# Patient Record
Sex: Female | Born: 1990 | Race: White | Hispanic: No | Marital: Single | Smoking: Current every day smoker
Health system: Southern US, Community
[De-identification: ages and names within clinical notes are randomized; demographics above are authoritative.]

## PROBLEM LIST (undated history)

## (undated) ENCOUNTER — Inpatient Hospital Stay: Payer: Self-pay

## (undated) DIAGNOSIS — F32A Depression, unspecified: Secondary | ICD-10-CM

## (undated) DIAGNOSIS — F319 Bipolar disorder, unspecified: Secondary | ICD-10-CM

## (undated) DIAGNOSIS — F329 Major depressive disorder, single episode, unspecified: Secondary | ICD-10-CM

## (undated) DIAGNOSIS — F419 Anxiety disorder, unspecified: Secondary | ICD-10-CM

## (undated) DIAGNOSIS — F609 Personality disorder, unspecified: Secondary | ICD-10-CM

## (undated) HISTORY — PX: MYRINGOTOMY: SHX2060

## (undated) HISTORY — PX: TUBAL LIGATION: SHX77

---

## 2015-03-12 ENCOUNTER — Encounter: Payer: Self-pay | Admitting: Emergency Medicine

## 2015-03-12 ENCOUNTER — Emergency Department
Admission: EM | Admit: 2015-03-12 | Discharge: 2015-03-12 | Disposition: A | Discharge status: Home / Self Care | Payer: Self-pay | Attending: Emergency Medicine | Admitting: Emergency Medicine

## 2015-03-12 ENCOUNTER — Emergency Department: Payer: Self-pay

## 2015-03-12 DIAGNOSIS — F17210 Nicotine dependence, cigarettes, uncomplicated: Secondary | ICD-10-CM | POA: Insufficient documentation

## 2015-03-12 DIAGNOSIS — N938 Other specified abnormal uterine and vaginal bleeding: Secondary | ICD-10-CM | POA: Insufficient documentation

## 2015-03-12 DIAGNOSIS — R197 Diarrhea, unspecified: Secondary | ICD-10-CM | POA: Insufficient documentation

## 2015-03-12 DIAGNOSIS — Z3202 Encounter for pregnancy test, result negative: Secondary | ICD-10-CM | POA: Insufficient documentation

## 2015-03-12 DIAGNOSIS — R102 Pelvic and perineal pain: Secondary | ICD-10-CM

## 2015-03-12 HISTORY — DX: Depression, unspecified: F32.A

## 2015-03-12 HISTORY — DX: Major depressive disorder, single episode, unspecified: F32.9

## 2015-03-12 LAB — CBC
HEMATOCRIT: 40.1 % (ref 35.0–47.0)
Hemoglobin: 13.4 g/dL (ref 12.0–16.0)
MCH: 29 pg (ref 26.0–34.0)
MCHC: 33.3 g/dL (ref 32.0–36.0)
MCV: 86.9 fL (ref 80.0–100.0)
Platelets: 233 10*3/uL (ref 150–440)
RBC: 4.62 MIL/uL (ref 3.80–5.20)
RDW: 14.2 % (ref 11.5–14.5)
WBC: 12 10*3/uL — AB (ref 3.6–11.0)

## 2015-03-12 LAB — COMPREHENSIVE METABOLIC PANEL
ALT: 14 U/L (ref 14–54)
ANION GAP: 7 (ref 5–15)
AST: 18 U/L (ref 15–41)
Albumin: 4.3 g/dL (ref 3.5–5.0)
Alkaline Phosphatase: 74 U/L (ref 38–126)
BILIRUBIN TOTAL: 0.3 mg/dL (ref 0.3–1.2)
BUN: 17 mg/dL (ref 6–20)
CO2: 18 mmol/L — ABNORMAL LOW (ref 22–32)
Calcium: 9 mg/dL (ref 8.9–10.3)
Chloride: 115 mmol/L — ABNORMAL HIGH (ref 101–111)
Creatinine, Ser: 0.89 mg/dL (ref 0.44–1.00)
Glucose, Bld: 110 mg/dL — ABNORMAL HIGH (ref 65–99)
POTASSIUM: 3.8 mmol/L (ref 3.5–5.1)
Sodium: 140 mmol/L (ref 135–145)
TOTAL PROTEIN: 7.7 g/dL (ref 6.5–8.1)

## 2015-03-12 LAB — WET PREP, GENITAL
Clue Cells Wet Prep HPF POC: NONE SEEN
TRICH WET PREP: NONE SEEN
YEAST WET PREP: NONE SEEN

## 2015-03-12 LAB — URINALYSIS COMPLETE WITH MICROSCOPIC (ARMC ONLY)
BILIRUBIN URINE: NEGATIVE
Bacteria, UA: NONE SEEN
GLUCOSE, UA: NEGATIVE mg/dL
KETONES UR: NEGATIVE mg/dL
LEUKOCYTES UA: NEGATIVE
Nitrite: NEGATIVE
PH: 5 (ref 5.0–8.0)
Protein, ur: NEGATIVE mg/dL
Specific Gravity, Urine: 1.017 (ref 1.005–1.030)

## 2015-03-12 LAB — POCT PREGNANCY, URINE: Preg Test, Ur: NEGATIVE

## 2015-03-12 LAB — LIPASE, BLOOD: Lipase: 22 U/L (ref 11–51)

## 2015-03-12 LAB — CHLAMYDIA/NGC RT PCR (ARMC ONLY)
Chlamydia Tr: NOT DETECTED
N gonorrhoeae: NOT DETECTED

## 2015-03-12 MED ORDER — OXYCODONE-ACETAMINOPHEN 5-325 MG PO TABS
1.0000 | ORAL_TABLET | Freq: Once | ORAL | Status: AC
  Administered 2015-03-12: 1 via ORAL
  Filled 2015-03-12: qty 1

## 2015-03-12 MED ORDER — OXYCODONE-ACETAMINOPHEN 5-325 MG PO TABS: 1.0000 | ORAL_TABLET | ORAL | Status: DC | PRN

## 2015-03-12 MED ORDER — ONDANSETRON 4 MG PO TBDP
4.0000 mg | ORAL_TABLET | Freq: Once | ORAL | Status: AC
  Administered 2015-03-12: 4 mg via ORAL
  Filled 2015-03-12: qty 1

## 2015-03-12 MED ORDER — IBUPROFEN 800 MG PO TABS: 800.0000 mg | ORAL_TABLET | Freq: Three times a day (TID) | ORAL | Status: DC | PRN

## 2015-03-12 NOTE — ED Provider Notes (Signed)
[facility] Emergency Department Provider Note  ____________________________________________  Time seen: Approximately 3:13 AM  I have reviewed the triage vital signs and the nursing notes.   HISTORY  Chief Complaint Abdominal Pain    HPI Maria Wallace is a 24 y.o. female who presents to the ED with a chief complaint of pelvic pain. Patient reports pelvic cramping for 3 weeks. She has an Implanon that was removed 2 years ago but has not done so secondary to financial reasons. Patient denies associated symptoms of fever, chills, nausea, vomiting. Patient reports diarrhea which started yesterday. Denies recent travel or trauma. Denies chest pain, shortness of breath, vaginal discharge, vaginal bleeding. Last sexual intercourse yesterday. Nothing makes her symptoms better or worse.   Past Medical History  Diagnosis Date  . Depression     There are no active problems to display for this patient.   Past Surgical History  Procedure Laterality Date  . Myringotomy      Current Outpatient Rx  Name  Route  Sig  Dispense  Refill  . diazepam (VALIUM) 5 MG tablet   Oral   Take 5 mg by mouth every 8 (eight) hours as needed for muscle spasms (cramping).          Marland Kitchen ibuprofen (ADVIL,MOTRIN) 800 MG tablet   Oral   Take 800 mg by mouth every 8 (eight) hours as needed for mild pain, moderate pain or cramping.            Allergies Review of patient's allergies indicates no known allergies.  History reviewed. No pertinent family history.  Social History Social History  Substance Use Topics  . Smoking status: Current Every Day Smoker -- 1.00 packs/day    Types: Cigarettes  . Smokeless tobacco: None  . Alcohol Use: Yes    Review of Systems Constitutional: No fever/chills Eyes: No visual changes. ENT: No sore throat. Cardiovascular: Denies chest pain. Respiratory: Denies shortness of breath. Gastrointestinal: Positive for pelvic pain. No abdominal  pain.  No nausea, no vomiting.  Positive for mild diarrhea.  No constipation. Genitourinary: Negative for dysuria. Musculoskeletal: Negative for back pain. Skin: Negative for rash. Neurological: Negative for headaches, focal weakness or numbness.  10-point ROS otherwise negative.  ____________________________________________   PHYSICAL EXAM:  VITAL SIGNS: ED Triage Vitals  Enc Vitals Group     BP 03/12/15 0123 128/74 mmHg     Pulse Rate 03/12/15 0123 105     Resp 03/12/15 0123 18     Temp 03/12/15 0123 97.7 F (36.5 C)     Temp Source 03/12/15 0123 Oral     SpO2 03/12/15 0123 100 %     Weight 03/12/15 0123 160 lb (72.576 kg)     Height 03/12/15 0123  (1.676 m)     Head Cir --      Peak Flow --      Pain Score 03/12/15 0124 9     Pain Loc --      Pain Edu? --      Excl. in GC? --     Constitutional: Alert and oriented. Well appearing and in no acute distress. Eyes: Conjunctivae are normal. PERRL. EOMI. Head: Atraumatic. Nose: No congestion/rhinnorhea. Mouth/Throat: Mucous membranes are moist.  Oropharynx non-erythematous. Neck: No stridor.   Cardiovascular: Normal rate, regular rhythm. Grossly normal heart sounds.  Good peripheral circulation. Respiratory: Normal respiratory effort.  No retractions. Lungs CTAB. Gastrointestinal: Soft and nontender. No distention. No abdominal bruits. No CVA tenderness. Musculoskeletal: No lower  extremity tenderness nor edema.  No joint effusions. Neurologic:  Normal speech and language. No gross focal neurologic deficits are appreciated. No gait instability. Skin:  Skin is warm, dry and intact. No rash noted. Psychiatric: Mood and affect are normal. Speech and behavior are normal.  ____________________________________________   LABS (all labs ordered are listed, but only abnormal results are displayed)  Labs Reviewed  WET PREP, GENITAL - Abnormal; Notable for the following:    WBC, Wet Prep HPF POC FEW (*)    All other  components within normal limits  COMPREHENSIVE METABOLIC PANEL - Abnormal; Notable for the following:    Chloride 115 (*)    CO2 18 (*)    Glucose, Bld 110 (*)    All other components within normal limits  CBC - Abnormal; Notable for the following:    WBC 12.0 (*)    All other components within normal limits  URINALYSIS COMPLETEWITH MICROSCOPIC ([facility] ONLY) - Abnormal; Notable for the following:    Color, Urine YELLOW (*)    APPearance CLEAR (*)    Hgb urine dipstick 3+ (*)    Squamous Epithelial / LPF 0-5 (*)    All other components within normal limits  CHLAMYDIA/NGC RT PCR ([facility] ONLY)  LIPASE, BLOOD  POC URINE PREG, ED  POCT PREGNANCY, URINE   ____________________________________________  EKG  None ____________________________________________  RADIOLOGY  Pelvis Ultrasound interpreted per Dr. Karie KirksBloomer: No acute pelvic process.  Thinned endometrium can be seen with atrophy.  ____________________________________________   PROCEDURES  Procedure(s) performed:  Pelvic exam: External exam WNL without rashes, lesions or vesicles. Speculum exam reveals mild vaginal bleeding with closed cervical os. Bimanual exam WNL.  Critical Care performed: No  ____________________________________________   INITIAL IMPRESSION / ASSESSMENT AND PLAN / ED COURSE  Pertinent labs & imaging results that were available during my care of the patient were reviewed by me and considered in my medical decision making (see chart for details).  24 year old female who presents with a three-week history of pelvic pain. Mild vaginal bleeding noted on exam. Urine pregnancy test negative. Labs otherwise unremarkable. Will send for pelvic ultrasound.  ----------------------------------------- 6:11 AM on 03/12/2015 -----------------------------------------  Updated patient and spouse of imaging results. Strict return precautions given. Both verbalize understanding and agree with plan of  care. ____________________________________________   FINAL CLINICAL IMPRESSION(S) / ED DIAGNOSES  Final diagnoses:  Pelvic pain in female  DUB (dysfunctional uterine bleeding)      Irean HongJade J Eavan Gonterman, MD 03/12/15 [phone]

## 2015-03-12 NOTE — ED Notes (Signed)
Patient transported to Ultrasound 

## 2015-03-12 NOTE — Discharge Instructions (Signed)
1. Take pain medicines as needed (Motrin/Percocet #15). 2. Return to the ER for worsening symptoms, persistent vomiting, heavy vaginal bleeding or other concerns.  Pelvic Pain, Female Female pelvic pain can be caused by many different things and start from a variety of places. Pelvic pain refers to pain that is located in the lower half of the abdomen and between your hips. The pain may occur over a short period of time (acute) or may be reoccurring (chronic). The cause of pelvic pain may be related to disorders affecting the female reproductive organs (gynecologic), but it may also be related to the bladder, kidney stones, an intestinal complication, or muscle or skeletal problems. Getting help right away for pelvic pain is important, especially if there has been severe, sharp, or a sudden onset of unusual pain. It is also important to get help right away because some types of pelvic pain can be life threatening.  CAUSES  Below are only some of the causes of pelvic pain. The causes of pelvic pain can be in one of several categories.   Gynecologic.  Pelvic inflammatory disease.  Sexually transmitted infection.  Ovarian cyst or a twisted ovarian ligament (ovarian torsion).  Uterine lining that grows outside the uterus (endometriosis).  Fibroids, cysts, or tumors.  Ovulation.  Pregnancy.  Pregnancy that occurs outside the uterus (ectopic pregnancy).  Miscarriage.  Labor.  Abruption of the placenta or ruptured uterus.  Infection.  Uterine infection (endometritis).  Bladder infection.  Diverticulitis.  Miscarriage related to a uterine infection (septic abortion).  Bladder.  Inflammation of the bladder (cystitis).  Kidney stone(s).  Gastrointestinal.  Constipation.  Diverticulitis.  Neurologic.  Trauma.  Feeling pelvic pain because of mental or emotional causes (psychosomatic).  Cancers of the bowel or pelvis. EVALUATION  Your caregiver will want to take a  careful history of your concerns. This includes recent changes in your health, a careful gynecologic history of your periods (menses), and a sexual history. Obtaining your family history and medical history is also important. Your caregiver may suggest a pelvic exam. A pelvic exam will help identify the location and severity of the pain. It also helps in the evaluation of which organ system may be involved. In order to identify the cause of the pelvic pain and be properly treated, your caregiver may order tests. These tests may include:   A pregnancy test.  Pelvic ultrasonography.  An X-ray exam of the abdomen.  A urinalysis or evaluation of vaginal discharge.  Blood tests. HOME CARE INSTRUCTIONS   Only take over-the-counter or prescription medicines for pain, discomfort, or fever as directed by your caregiver.   Rest as directed by your caregiver.   Eat a balanced diet.   Drink enough fluids to make your urine clear or pale yellow, or as directed.   Avoid sexual intercourse if it causes pain.   Apply warm or cold compresses to the lower abdomen depending on which one helps the pain.   Avoid stressful situations.   Keep a journal of your pelvic pain. Write down when it started, where the pain is located, and if there are things that seem to be associated with the pain, such as food or your menstrual cycle.  Follow up with your caregiver as directed.  SEEK MEDICAL CARE IF:  Your medicine does not help your pain.  You have abnormal vaginal discharge. SEEK IMMEDIATE MEDICAL CARE IF:   You have heavy bleeding from the vagina.   Your pelvic pain increases.   You feel  light-headed or faint.   You have chills.   You have pain with urination or blood in your urine.   You have uncontrolled diarrhea or vomiting.   You have a fever or persistent symptoms for more than 3 days.  You have a fever and your symptoms suddenly get worse.   You are being physically or  sexually abused.   This information is not intended to replace advice given to you by your health care provider. Make sure you discuss any questions you have with your health care provider.   Document Released: 02/19/2004 Document Revised: 12/13/2014 Document Reviewed: 07/14/2011 Elsevier Interactive Patient Education 2016 Else[employer] Inc[employer]  Abnormal Uterine Bleeding Abnormal uterine bleeding can affect women at various stages in life, including teenagers, women in their reproductive years, pregnant women, and women who have reached menopause. Several kinds of uterine bleeding are considered abnormal, including:  Bleeding or spotting between periods.   Bleeding after sexual intercourse.   Bleeding that is heavier or more than normal.   Periods that last longer than usual.  Bleeding after menopause.  Many cases of abnormal uterine bleeding are minor and simple to treat, while others are more serious. Any type of abnormal bleeding should be evaluated by your health care provider. Treatment will depend on the cause of the bleeding. HOME CARE INSTRUCTIONS Monitor your condition for any changes. The following actions may help to alleviate any discomfort you are experiencing:  Avoid the use of tampons and douches as directed by your health care provider.  Change your pads frequently. You should get regular pelvic exams and Pap tests. Keep all follow-up appointments for diagnostic tests as directed by your health care provider.  SEEK MEDICAL CARE IF:   Your bleeding lasts more than 1 week.   You feel dizzy at times.  SEEK IMMEDIATE MEDICAL CARE IF:   You pass out.   You are changing pads every 15 to 30 minutes.   You have abdominal pain.  You have a fever.   You become sweaty or weak.   You are passing large blood clots from the vagina.   You start to feel nauseous and vomit. MAKE SURE YOU:   Understand these instructions.  Will watch your condition.  Will get  help right away if you are not doing well or get worse.   This information is not intended to replace advice given to you by your health care provider. Make sure you discuss any questions you have with your health care provider.   Document Released: 03/24/2005 Document Revised: 03/29/2013 Document Reviewed: 10/21/2012 Elsevier Interactive Patient Education [employer]

## 2015-03-12 NOTE — ED Notes (Signed)
Patient reports having cramping in lower abdomen below "belly button" for approximately 3 weeks.  Denies nausea or vomiting.  Patient reports having some vaginal spotting.

## 2015-03-12 NOTE — ED Notes (Signed)
Pt c/o abdominal cramping just below umbilicus for 3 weeks; came tonight because she couldn't sleep; LMP unsure-implanon; pt says she was supposed to have that removed 2 years ago but does not have financial means to do so; diarrhea started Sunday; pt in no acute distress

## 2015-06-14 ENCOUNTER — Emergency Department
Admission: EM | Admit: 2015-06-14 | Discharge: 2015-06-14 | Disposition: A | Discharge status: Home / Self Care | Payer: Medicaid Other | Attending: Emergency Medicine | Admitting: Emergency Medicine

## 2015-06-14 ENCOUNTER — Encounter: Payer: Self-pay | Admitting: *Deleted

## 2015-06-14 DIAGNOSIS — O9989 Other specified diseases and conditions complicating pregnancy, childbirth and the puerperium: Secondary | ICD-10-CM | POA: Diagnosis present

## 2015-06-14 DIAGNOSIS — O26811 Pregnancy related exhaustion and fatigue, first trimester: Secondary | ICD-10-CM | POA: Diagnosis not present

## 2015-06-14 DIAGNOSIS — F17210 Nicotine dependence, cigarettes, uncomplicated: Secondary | ICD-10-CM | POA: Insufficient documentation

## 2015-06-14 DIAGNOSIS — Z3A09 9 weeks gestation of pregnancy: Secondary | ICD-10-CM | POA: Insufficient documentation

## 2015-06-14 DIAGNOSIS — O99331 Smoking (tobacco) complicating pregnancy, first trimester: Secondary | ICD-10-CM | POA: Insufficient documentation

## 2015-06-14 LAB — POCT PREGNANCY, URINE: Preg Test, Ur: POSITIVE — AB

## 2015-06-14 LAB — CBC WITH DIFFERENTIAL/PLATELET
BASOS PCT: 1 %
Basophils Absolute: 0.1 10*3/uL (ref 0–0.1)
Eosinophils Absolute: 0.1 10*3/uL (ref 0–0.7)
Eosinophils Relative: 1 %
HCT: 38.5 % (ref 35.0–47.0)
Hemoglobin: 13.2 g/dL (ref 12.0–16.0)
Lymphocytes Relative: 18 %
Lymphs Abs: 2.5 10*3/uL (ref 1.0–3.6)
MCH: 29.5 pg (ref 26.0–34.0)
MCHC: 34.1 g/dL (ref 32.0–36.0)
MCV: 86.3 fL (ref 80.0–100.0)
Monocytes Absolute: 0.6 10*3/uL (ref 0.2–0.9)
Monocytes Relative: 5 %
NEUTROS ABS: 10.4 10*3/uL — AB (ref 1.4–6.5)
NEUTROS PCT: 75 %
Platelets: 256 10*3/uL (ref 150–440)
RBC: 4.46 MIL/uL (ref 3.80–5.20)
RDW: 14.3 % (ref 11.5–14.5)
WBC: 13.7 10*3/uL — ABNORMAL HIGH (ref 3.6–11.0)

## 2015-06-14 LAB — URINALYSIS COMPLETE WITH MICROSCOPIC (ARMC ONLY)
BACTERIA UA: NONE SEEN
Bilirubin Urine: NEGATIVE
GLUCOSE, UA: NEGATIVE mg/dL
Hgb urine dipstick: NEGATIVE
Ketones, ur: NEGATIVE mg/dL
Leukocytes, UA: NEGATIVE
NITRITE: NEGATIVE
Protein, ur: NEGATIVE mg/dL
SPECIFIC GRAVITY, URINE: 1.018 (ref 1.005–1.030)
pH: 6 (ref 5.0–8.0)

## 2015-06-14 LAB — BASIC METABOLIC PANEL
ANION GAP: 7 (ref 5–15)
BUN: 13 mg/dL (ref 6–20)
CALCIUM: 9.7 mg/dL (ref 8.9–10.3)
CO2: 22 mmol/L (ref 22–32)
Chloride: 107 mmol/L (ref 101–111)
Creatinine, Ser: 0.51 mg/dL (ref 0.44–1.00)
Glucose, Bld: 96 mg/dL (ref 65–99)
POTASSIUM: 3.2 mmol/L — AB (ref 3.5–5.1)
Sodium: 136 mmol/L (ref 135–145)

## 2015-06-14 NOTE — ED Notes (Signed)
Pt in via triage w/ complaints of increased fatigue over the last few days.  Pt reports sleeping all day which is not normal for her.  Pt reports being [redacted] weeks pregnant.  Pt A/Ox4, no immediate distress at this time.

## 2015-06-14 NOTE — ED Provider Notes (Signed)
[facility] Emergency Department Provider Note  ____________________________________________  Time seen: 9:45 PM  I have reviewed the triage vital signs and the nursing notes.   HISTORY  Chief Complaint No chief complaint on file.    HPI Maria Wallace is a 25 y.o. female who complains of fatigue for the past week. No pain no nausea vomiting or diarrhea, eating normally. No dysuria frequency urgency. She is [redacted] weeks pregnant. Yesterday she went to sleep at 11:30 PM and slept until the next afternoon. No other acute complaints. [facility]. Taking prenatal vitamins.     Past Medical History  Diagnosis Date  . Depression      There are no active problems to display for this patient.    Past Surgical History  Procedure Laterality Date  . Myringotomy       Current Outpatient Rx  Name  Route  Sig  Dispense  Refill  . diazepam (VALIUM) 5 MG tablet   Oral   Take 5 mg by mouth every 8 (eight) hours as needed for muscle spasms (cramping).          Marland Kitchen ibuprofen (ADVIL,MOTRIN) 800 MG tablet   Oral   Take 1 tablet (800 mg total) by mouth every 8 (eight) hours as needed for moderate pain.   15 tablet   0   . oxyCODONE-acetaminophen (ROXICET) 5-325 MG tablet   Oral   Take 1 tablet by mouth every 4 (four) hours as needed for severe pain.   15 tablet   0    no longer taking any of these, only prenatal vitamins.   Allergies Review of patient's allergies indicates no known allergies.   No family history on file.  Social History Social History  Substance Use Topics  . Smoking status: Current Every Day Smoker -- 1.00 packs/day    Types: Cigarettes  . Smokeless tobacco: None  . Alcohol Use: Yes    Review of Systems  Constitutional:   No fever or chills. No weight changes Eyes:   No blurry vision or double vision.  ENT:   No sore throat.  Cardiovascular:   No chest pain. Respiratory:   No dyspnea or  cough. Gastrointestinal:   Negative for abdominal pain, vomiting and diarrhea.  No BRBPR or melena. Genitourinary:   Negative for dysuria or difficulty urinating. Musculoskeletal:   Negative for back pain. No joint swelling or pain. Skin:   Negative for rash. Neurological:   Negative for headaches, focal weakness or numbness. Psychiatric:  No anxiety or depression.   Endocrine:  Low energy, increased sleep..  10-point ROS otherwise negative.  ____________________________________________   PHYSICAL EXAM:  VITAL SIGNS: ED Triage Vitals  Enc Vitals Group     BP 06/14/15 2103 137/76 mmHg     Pulse Rate 06/14/15 2103 106     Resp 06/14/15 2103 20     Temp 06/14/15 2103 98.2 F (36.8 C)     Temp Source 06/14/15 2103 Oral     SpO2 06/14/15 2103 99 %     Weight 06/14/15 2103 135 lb (61.236 kg)     Height 06/14/15 2103  (1.676 m)     Head Cir --      Peak Flow --      Pain Score --      Pain Loc --      Pain Edu? --      Excl. in GC? --     Vital signs reviewed,  nursing assessments reviewed.   Constitutional:   Alert and oriented. Well appearing and in no distress. Eyes:   No scleral icterus. No conjunctival pallor. PERRL. EOMI ENT   Head:   Normocephalic and atraumatic.   Nose:   No congestion/rhinnorhea. No septal hematoma   Mouth/Throat:   MMM, no pharyngeal erythema. No peritonsillar mass.    Neck:   No stridor. No SubQ emphysema. No meningismus. Hematological/Lymphatic/Immunilogical:   No cervical lymphadenopathy. Cardiovascular:   RRR. Symmetric bilateral radial and DP pulses.  No murmurs.  Respiratory:   Normal respiratory effort without tachypnea nor retractions. Breath sounds are clear and equal bilaterally. No wheezes/rales/rhonchi. Gastrointestinal:   Soft and nontender. Non distended. There is no CVA tenderness.  No rebound, rigidity, or guarding. Genitourinary:   deferred Musculoskeletal:   Nontender with normal range of motion in all  extremities. No joint effusions.  No lower extremity tenderness.  No edema. Neurologic:   Normal speech and language.  CN 2-10 normal. Motor grossly intact. No gross focal neurologic deficits are appreciated.  Skin:    Skin is warm, dry and intact. No rash noted.  No petechiae, purpura, or bullae. No edema or anasarca Psychiatric:   Mood and affect are normal. ____________________________________________    LABS (pertinent positives/negatives) (all labs ordered are listed, but only abnormal results are displayed) Labs Reviewed  BASIC METABOLIC PANEL - Abnormal; Notable for the following:    Potassium 3.2 (*)    All other components within normal limits  CBC WITH DIFFERENTIAL/PLATELET - Abnormal; Notable for the following:    WBC 13.7 (*)    Neutro Abs 10.4 (*)    All other components within normal limits  URINALYSIS COMPLETEWITH MICROSCOPIC ([facility] ONLY) - Abnormal; Notable for the following:    Color, Urine YELLOW (*)    APPearance CLOUDY (*)    Squamous Epithelial / LPF 6-30 (*)    All other components within normal limits  POCT PREGNANCY, URINE - Abnormal; Notable for the following:    Preg Test, Ur POSITIVE (*)    All other components within normal limits  POC URINE PREG, ED   ____________________________________________   EKG    ____________________________________________    RADIOLOGY    ____________________________________________   PROCEDURES   ____________________________________________   INITIAL IMPRESSION / ASSESSMENT AND PLAN / ED COURSE  Pertinent labs & imaging results that were available during my care of the patient were reviewed by me and considered in my medical decision making (see chart for details).  Patient well appearing no acute distress. This appears to be pregnancy related fatigue without any complications. I have a low suspicion for gestational hypothyroidism. We'll have her continue to follow up with the health department,  extensive anticipatory guidance and counseling given on what to expect throughout the remainder of her first trimester, adequate hydration, management of any morning sickness symptoms.     ____________________________________________   FINAL CLINICAL IMPRESSION(S) / ED DIAGNOSES  Final diagnoses:  Pregnancy related fatigue in first trimester      Sharman CheekPhillip Amandalee Lacap, MD 06/14/15 2212

## 2015-06-14 NOTE — Discharge Instructions (Signed)
Fatigue  Fatigue is feeling tired all of the time, a lack of energy, or a lack of motivation. Occasional or mild fatigue is often a normal response to activity or life in general. However, long-lasting (chronic) or extreme fatigue may indicate an underlying medical condition.  HOME CARE INSTRUCTIONS   Watch your fatigue for any changes. The following actions may help to lessen any discomfort you are feeling:  · Talk to your health care provider about how much sleep you need each night. Try to get the required amount every night.  · Take medicines only as directed by your health care provider.  · Eat a healthy and nutritious diet. Ask your health care provider if you need help changing your diet.  · Drink enough fluid to keep your urine clear or pale yellow.  · Practice ways of relaxing, such as yoga, meditation, massage therapy, or acupuncture.  · Exercise regularly.    · Change situations that cause you stress. Try to keep your work and personal routine reasonable.  · Do not abuse illegal drugs.  · Limit alcohol intake to no more than 1 drink per day for nonpregnant women and 2 drinks per day for men. One drink equals 12 ounces of beer, 5 ounces of wine, or 1½ ounces of hard liquor.  · Take a multivitamin, if directed by your health care provider.  SEEK MEDICAL CARE IF:   · Your fatigue does not get better.  · You have a fever.    · You have unintentional weight loss or gain.  · You have headaches.    · You have difficulty:      Falling asleep.    Sleeping throughout the night.  · You feel angry, guilty, anxious, or sad.     · You are unable to have a bowel movement (constipation).    · You skin is dry.     · Your legs or another part of your body is swollen.    SEEK IMMEDIATE MEDICAL CARE IF:   · You feel confused.    · Your vision is blurry.  · You feel faint or pass out.    · You have a severe headache.    · You have severe abdominal, pelvic, or back pain.    · You have chest pain, shortness of breath, or an  irregular or fast heartbeat.    · You are unable to urinate or you urinate less than normal.    · You develop abnormal bleeding, such as bleeding from the rectum, vagina, nose, lungs, or nipples.  · You vomit blood.     · You have thoughts about harming yourself or committing suicide.    · You are worried that you might harm someone else.       This information is not intended to replace advice given to you by your health care provider. Make sure you discuss any questions you have with your health care provider.     Document Released: 01/19/2007 Document Revised: 04/14/2014 Document Reviewed: 07/26/2013  Elsevier Interactive Patient Education ©2016 [employer] Inc[employer]

## 2015-06-14 NOTE — ED Notes (Addendum)
Pt is approx [redacted] weeks pregnant.  Pt states she is feeling tired a lot.  No n/v/d.  No abd pain.  No vag bleeding.  No urinary sx.

## 2015-09-10 ENCOUNTER — Observation Stay
Admission: EM | Admit: 2015-09-10 | Discharge: 2015-09-10 | Disposition: A | Discharge status: Home / Self Care | Payer: Medicaid Other | Attending: Obstetrics & Gynecology | Admitting: Obstetrics & Gynecology

## 2015-09-10 DIAGNOSIS — O9989 Other specified diseases and conditions complicating pregnancy, childbirth and the puerperium: Principal | ICD-10-CM | POA: Insufficient documentation

## 2015-09-10 DIAGNOSIS — R141 Gas pain: Secondary | ICD-10-CM | POA: Diagnosis not present

## 2015-09-10 DIAGNOSIS — Z3A22 22 weeks gestation of pregnancy: Secondary | ICD-10-CM | POA: Diagnosis not present

## 2015-09-10 LAB — URINALYSIS COMPLETE WITH MICROSCOPIC (ARMC ONLY)
Bacteria, UA: NONE SEEN
Bilirubin Urine: NEGATIVE
Glucose, UA: NEGATIVE mg/dL
Ketones, ur: NEGATIVE mg/dL
Leukocytes, UA: NEGATIVE
Nitrite: NEGATIVE
Protein, ur: NEGATIVE mg/dL
Specific Gravity, Urine: 1.008 (ref 1.005–1.030)
pH: 7 (ref 5.0–8.0)

## 2015-09-10 LAB — URINE DRUG SCREEN, QUALITATIVE (ARMC ONLY)
Amphetamines, Ur Screen: NOT DETECTED
BENZODIAZEPINE, UR SCRN: NOT DETECTED
Barbiturates, Ur Screen: NOT DETECTED
CANNABINOID 50 NG, UR ~~LOC~~: NOT DETECTED
Cocaine Metabolite,Ur ~~LOC~~: NOT DETECTED
MDMA (ECSTASY) UR SCREEN: NOT DETECTED
Methadone Scn, Ur: NOT DETECTED
Opiate, Ur Screen: NOT DETECTED
PHENCYCLIDINE (PCP) UR S: NOT DETECTED
TRICYCLIC, UR SCREEN: NOT DETECTED

## 2015-09-10 MED ORDER — SIMETHICONE 80 MG PO CHEW
80.0000 mg | CHEWABLE_TABLET | Freq: Once | ORAL | Status: AC
  Administered 2015-09-10: 80 mg via ORAL
  Filled 2015-09-10: qty 1

## 2015-09-10 NOTE — Plan of Care (Signed)
Pt discharged home after reporting urine results to [facility]. Pt left with boyfriend in stable condition. Instructed to keep appointment at [facility] on June 12.

## 2015-09-10 NOTE — Progress Notes (Signed)
Maria MallJane Wallace, CNM in room to assess and speak with pt and discuss plan. Tracing maternal HR. Reviewed earlier tracing, fetal heart tracing reactive given gestational age, per CNM, ultrasound may be removed, toco remain on abdomen, contractions not palpated.

## 2015-09-10 NOTE — Discharge Summary (Signed)
Physician Discharge Summary  Patient ID: Maria Wallace MRN: [id number] DOB/AGE: [birth year] 25 y.o.  Admit date: 09/10/2015 Discharge date: 09/10/2015  Admission Diagnoses: G2P1 at 22 weeks with lower abdominal pain like she has to have a bowel movement, states positive fetal movement, denies Contractions, LOF, VB, constipation, urinary/gyn symptoms  Discharge Diagnoses:  Active Problems:   Indication for care in labor and delivery, antepartum gas pain, reassuring fetal monitoring, negative toco  Discharged Condition: good  Hospital Course: admitted for observation, placed on monitors, labs sent  Consults: None  Significant Diagnostic Studies: labs:  Results for Maria, Wallace (MRN [id number]) as of 09/10/2015 22:17  Ref. Range 09/10/2015 21:19  Appearance Latest Ref Range: CLEAR  CLEAR (A)  Bacteria, UA Latest Ref Range: NONE SEEN  NONE SEEN  Bilirubin Urine Latest Ref Range: NEGATIVE  NEGATIVE  Color, Urine Latest Ref Range: YELLOW  YELLOW (A)  Glucose Latest Ref Range: NEGATIVE mg/dL NEGATIVE  Hgb urine dipstick Latest Ref Range: NEGATIVE  1+ (A)  Ketones, ur Latest Ref Range: NEGATIVE mg/dL NEGATIVE  Leukocytes, UA Latest Ref Range: NEGATIVE  NEGATIVE  Mucous Unknown PRESENT  Nitrite Latest Ref Range: NEGATIVE  NEGATIVE  pH Latest Ref Range: 5.0-8.0  7.0  Protein Latest Ref Range: NEGATIVE mg/dL NEGATIVE  RBC / HPF Latest Ref Range: 0-5 RBC/hpf 0-5  Specific Gravity, Urine Latest Ref Range: 1.005-1.030  1.008  Squamous Epithelial / LPF Latest Ref Range: NONE SEEN  0-5 (A)  WBC, UA Latest Ref Range: 0-5 WBC/hpf 0-5  Amphetamines, Ur Screen Latest Ref Range: NONE DETECTED  NONE DETECTED  Barbiturates, Ur Screen Latest Ref Range: NONE DETECTED  NONE DETECTED  Benzodiazepine, Ur Scrn Latest Ref Range: NONE DETECTED  NONE DETECTED  Cocaine Metabolite,Ur [place] Latest Ref Range: NONE DETECTED  NONE DETECTED  Methadone Scn, Ur Latest Ref Range: NONE DETECTED  NONE DETECTED  MDMA (Ecstasy)Ur  Screen Latest Ref Range: NONE DETECTED  NONE DETECTED  Cannabinoid 50 Ng, Ur [place] Latest Ref Range: NONE DETECTED  NONE DETECTED  Opiate, Ur Screen Latest Ref Range: NONE DETECTED  NONE DETECTED  Phencyclidine (PCP) Ur S Latest Ref Range: NONE DETECTED  NONE DETECTED  Tricyclic, Ur Screen Latest Ref Range: NONE DETECTED  NONE DETECTED  URINE CULTURE Unknown Rpt    Treatments: Simethicone for gas pain  Cervix: deferred Toco: negative Fetal Well Being: 150 bpm baseline, moderate variability, + accelerations, - decelerations  Discharge Exam: Blood pressure 98/57, pulse 95, temperature 98.3 F (36.8 C), resp. rate 18, last menstrual period 04/04/2015. General appearance: alert, cooperative and appears stated age  Disposition: Discharge to home      Discharge Instructions    Discharge activity:  No Restrictions    Complete by:  As directed      Discharge diet:  No restrictions    Complete by:  As directed      No sexual activity restrictions    Complete by:  As directed      Notify physician for a general feeling that "something is not right"    Complete by:  As directed      Notify physician for increase or change in vaginal discharge    Complete by:  As directed      Notify physician for intestinal cramps, with or without diarrhea, sometimes described as "gas pain"    Complete by:  As directed      Notify physician for leaking of fluid    Complete by:  As directed  Notify physician for low, dull backache, unrelieved by heat or Tylenol    Complete by:  As directed      Notify physician for menstrual like cramps    Complete by:  As directed      Notify physician for pelvic pressure    Complete by:  As directed      Notify physician for uterine contractions.  These may be painless and feel like the uterus is tightening or the baby is  "balling up"    Complete by:  As directed      Notify physician for vaginal bleeding    Complete by:  As directed      PRETERM LABOR:   Includes any of the follwing symptoms that occur between 20 - [redacted] weeks gestation.  If these symptoms are not stopped, preterm labor can result in preterm delivery, placing your baby at risk    Complete by:  As directed          Other instructions: Recommendation to quit/decrease cigarette use, Stay hydrated with H2O,  Eat healthy foods, Get adequate sleep   Medication List    STOP taking these medications        diazepam 5 MG tablet  Commonly known as:  VALIUM     ibuprofen 800 MG tablet  Commonly known as:  ADVIL,MOTRIN     oxyCODONE-acetaminophen 5-325 MG tablet  Commonly known as:  ROXICET       Follow-up Information    Go on 09/17/2015 to follow up.   Why:  follow up prenatal visit      Signed: Tresea MallGLEDHILL,Hiroki Wint, CNM

## 2015-09-12 LAB — URINE CULTURE: Culture: 50000 — AB

## 2016-01-14 ENCOUNTER — Encounter: Payer: Self-pay | Admitting: Emergency Medicine

## 2016-01-14 DIAGNOSIS — R51 Headache: Secondary | ICD-10-CM | POA: Diagnosis not present

## 2016-01-14 DIAGNOSIS — F17210 Nicotine dependence, cigarettes, uncomplicated: Secondary | ICD-10-CM | POA: Insufficient documentation

## 2016-01-14 DIAGNOSIS — R110 Nausea: Secondary | ICD-10-CM | POA: Insufficient documentation

## 2016-01-14 NOTE — ED Triage Notes (Signed)
Patient ambulatory to triage with steady gait, without difficulty or distress noted; pt reports frontal HA x 6 days post epidural

## 2016-01-15 ENCOUNTER — Emergency Department
Admission: EM | Admit: 2016-01-15 | Discharge: 2016-01-15 | Disposition: A | Discharge status: Home / Self Care | Payer: Medicaid Other | Attending: Emergency Medicine | Admitting: Emergency Medicine

## 2016-01-15 DIAGNOSIS — R519 Headache, unspecified: Secondary | ICD-10-CM

## 2016-01-15 DIAGNOSIS — R51 Headache: Secondary | ICD-10-CM

## 2016-01-15 MED ORDER — KETOROLAC TROMETHAMINE 30 MG/ML IJ SOLN: 10.0000 mg | Freq: Once | INTRAMUSCULAR | Status: DC

## 2016-01-15 MED ORDER — KETOROLAC TROMETHAMINE 30 MG/ML IJ SOLN
10.0000 mg | Freq: Once | INTRAMUSCULAR | Status: AC
  Administered 2016-01-15: 9.9 mg via INTRAMUSCULAR
  Filled 2016-01-15: qty 1

## 2016-01-15 MED ORDER — PROCHLORPERAZINE EDISYLATE 5 MG/ML IJ SOLN
5.0000 mg | Freq: Once | INTRAMUSCULAR | Status: AC
  Administered 2016-01-15: 5 mg via INTRAVENOUS
  Filled 2016-01-15: qty 2

## 2016-01-15 MED ORDER — DIPHENHYDRAMINE HCL 50 MG/ML IJ SOLN
12.5000 mg | Freq: Once | INTRAMUSCULAR | Status: AC
  Administered 2016-01-15: 12.5 mg via INTRAVENOUS
  Filled 2016-01-15: qty 1

## 2016-01-15 MED ORDER — PROCHLORPERAZINE MALEATE 10 MG PO TABS: 10.0000 mg | ORAL_TABLET | Freq: Four times a day (QID) | ORAL | 0 refills | Status: DC | PRN

## 2016-01-15 MED ORDER — DEXTROSE-NACL 5-0.45 % IV SOLN
INTRAVENOUS | Status: DC
  Administered 2016-01-15: 02:00:00 via INTRAVENOUS

## 2016-01-15 NOTE — ED Notes (Signed)

## 2016-01-15 NOTE — ED Notes (Signed)
Pt. Resting at this time in NAD. Will continue to monitor.

## 2016-01-15 NOTE — Discharge Instructions (Signed)
1. You may take Compazine as needed for headaches. °2. Return to the ER for worsening symptoms, persistent vomiting, difficulty breathing or other concerns. °

## 2016-01-15 NOTE — ED Provider Notes (Signed)
[facility] Emergency Department Provider Note   ____________________________________________   First MD Initiated Contact with Patient 01/15/16 0121     (approximate)  I have reviewed the triage vital signs and the nursing notes.   HISTORY  Chief Complaint Headache    HPI Maria Wallace is a 25 y.o. female who presents to the ED from home with a chief complaint of headache. Patient is 6 days postpartum who has had a global headache since her epidural 6 days ago. Denies pregnancy induced hypertension. No prior history of migraine headaches. Symptoms associated with nausea only. Denies associated fever, chills, vision changes, neck pain, chest pain, shortness of breath, abdominal pain, vomiting, diarrhea. Nothing makes her symptoms better or worse. She is not breast-feeding. Denies recent travel or trauma.   Past Medical History:  Diagnosis Date  . Depression     Patient Active Problem List   Diagnosis Date Noted  . Indication for care in labor and delivery, antepartum 09/10/2015    Past Surgical History:  Procedure Laterality Date  . MYRINGOTOMY      Prior to Admission medications   Not on File    Allergies Review of patient's allergies indicates no known allergies.  No family history on file.  Social History Social History  Substance Use Topics  . Smoking status: Current Every Day Smoker    Packs/day: 1.00    Types: Cigarettes  . Smokeless tobacco: Never Used  . Alcohol use Yes    Review of Systems  Constitutional: No fever/chills. Eyes: No visual changes. ENT: No sore throat. Cardiovascular: Denies chest pain. Respiratory: Denies shortness of breath. Gastrointestinal: No abdominal pain.  No nausea, no vomiting.  No diarrhea.  No constipation. Genitourinary: Negative for dysuria. Musculoskeletal: Negative for back pain. Skin: Negative for rash. Neurological: Positive for headache. Negative for focal weakness or  numbness.  10-point ROS otherwise negative.  ____________________________________________   PHYSICAL EXAM:  VITAL SIGNS: ED Triage Vitals  Enc Vitals Group     BP 01/14/16 2259 101/71     Pulse Rate 01/14/16 2259 68     Resp 01/14/16 2259 20     Temp 01/14/16 2259 98.2 F (36.8 C)     Temp Source 01/14/16 2259 Oral     SpO2 01/14/16 2259 100 %     Weight 01/14/16 2257 150 lb (68 kg)     Height 01/14/16 2257 5\' 5"  (1.651 m)     Head Circumference --      Peak Flow --      Pain Score 01/14/16 2257 9     Pain Loc --      Pain Edu? --      Excl. in GC? --     Constitutional: Alert and oriented. Well appearing and in no acute distress. Eyes: Conjunctivae are normal. PERRL. EOMI. Head: Atraumatic. Nose: No congestion/rhinnorhea. Mouth/Throat: Mucous membranes are moist.  Oropharynx non-erythematous. Neck: No stridor.  Supple neck without meningismus. Cardiovascular: Normal rate, regular rhythm. Grossly normal heart sounds.  Good peripheral circulation. Respiratory: Normal respiratory effort.  No retractions. Lungs CTAB. Gastrointestinal: Soft and nontender. No distention. No abdominal bruits. No CVA tenderness. Musculoskeletal: No lower extremity tenderness nor edema.  No joint effusions. Neurologic:  Alert and oriented 4. Normal speech and language. No gross focal neurologic deficits are appreciated. No gait instability. Skin:  Skin is warm, dry and intact. No rash noted. Psychiatric: Mood and affect are normal. Speech and behavior are normal.  ____________________________________________   LABS (all  labs ordered are listed, but only abnormal results are displayed)  Labs Reviewed - No data to display ____________________________________________  EKG  None ____________________________________________  RADIOLOGY  None ____________________________________________   PROCEDURES  Procedure(s) performed: None  Procedures  Critical Care performed:  No  ____________________________________________   INITIAL IMPRESSION / ASSESSMENT AND PLAN / ED COURSE  Pertinent labs & imaging results that were available during my care of the patient were reviewed by me and considered in my medical decision making (see chart for details).  25 year old female with a global headache 6 days status post epidural. She is normotensive and otherwise without clinical suspicion for postpartum preeclampsia. Neck is supple with no focal neurological deficits noted on exam. Likely she is having a headache secondary to epidural. Will infuse IV fluids, analgesia, antiemetic and reassess.  Clinical Course  Comment By Time  Patient improved. Neck remains supple and there are no focal neurological deficits on my reexamination. Prescription for Compazine and patient will follow-up with her PCP this week. Strict return precautions given. Patient verbalizes understanding and agrees with plan of care. Irean HongJade J Sung, MD 10/10 [phone]     ____________________________________________   FINAL CLINICAL IMPRESSION(S) / ED DIAGNOSES  Final diagnoses:  Acute nonintractable headache, unspecified headache type      NEW MEDICATIONS STARTED DURING THIS VISIT:  New Prescriptions   No medications on file     Note:  This document was prepared using Dragon voice recognition software and may include unintentional dictation errors.    Irean HongJade J Sung, MD 01/15/16 [phone]

## 2016-01-15 NOTE — ED Notes (Signed)
Pt. Reporting pain 9/10 headache since delivery 6 days ago. Reports mild nausea, denies vision changes, denies V/D. Denies dizziness, or other associated sx.

## 2017-07-02 ENCOUNTER — Encounter: Payer: Self-pay | Admitting: Obstetrics and Gynecology

## 2017-07-09 ENCOUNTER — Encounter: Payer: Self-pay | Admitting: Obstetrics and Gynecology

## 2017-07-16 ENCOUNTER — Encounter: Payer: Self-pay | Admitting: Obstetrics and Gynecology

## 2018-02-05 ENCOUNTER — Encounter: Payer: Self-pay | Admitting: Emergency Medicine

## 2018-02-05 ENCOUNTER — Emergency Department: Payer: Medicaid Other

## 2018-02-05 ENCOUNTER — Emergency Department
Admission: EM | Admit: 2018-02-05 | Discharge: 2018-02-05 | Disposition: A | Discharge status: Home / Self Care | Payer: Medicaid Other | Attending: Emergency Medicine | Admitting: Emergency Medicine

## 2018-02-05 ENCOUNTER — Other Ambulatory Visit: Payer: Self-pay

## 2018-02-05 DIAGNOSIS — Z9189 Other specified personal risk factors, not elsewhere classified: Secondary | ICD-10-CM | POA: Insufficient documentation

## 2018-02-05 DIAGNOSIS — R079 Chest pain, unspecified: Secondary | ICD-10-CM | POA: Diagnosis not present

## 2018-02-05 DIAGNOSIS — F17210 Nicotine dependence, cigarettes, uncomplicated: Secondary | ICD-10-CM | POA: Diagnosis not present

## 2018-02-05 LAB — BASIC METABOLIC PANEL
Anion gap: 7 (ref 5–15)
BUN: 14 mg/dL (ref 6–20)
CALCIUM: 9.7 mg/dL (ref 8.9–10.3)
CO2: 25 mmol/L (ref 22–32)
Chloride: 106 mmol/L (ref 98–111)
Creatinine, Ser: 0.71 mg/dL (ref 0.44–1.00)
GFR calc Af Amer: >60 mL/min (ref >60)
GLUCOSE: 93 mg/dL (ref 70–99)
Potassium: 3.9 mmol/L (ref 3.5–5.1)
SODIUM: 138 mmol/L (ref 135–145)

## 2018-02-05 LAB — FIBRIN DERIVATIVES D-DIMER (ARMC ONLY): FIBRIN DERIVATIVES D-DIMER (ARMC): 255.9 ng{FEU}/mL (ref 0.00–499.00)

## 2018-02-05 LAB — CBC
HCT: 44.6 % (ref 36.0–46.0)
Hemoglobin: 14.4 g/dL (ref 12.0–15.0)
MCH: 26.6 pg (ref 26.0–34.0)
MCHC: 32.3 g/dL (ref 30.0–36.0)
MCV: 82.4 fL (ref 80.0–100.0)
PLATELETS: 295 10*3/uL (ref 150–400)
RBC: 5.41 MIL/uL — ABNORMAL HIGH (ref 3.87–5.11)
RDW: 13 % (ref 11.5–15.5)
WBC: 9.7 10*3/uL (ref 4.0–10.5)
nRBC: 0 % (ref 0.0–0.2)

## 2018-02-05 LAB — HCG, QUANTITATIVE, PREGNANCY: hCG, Beta Chain, Quant, S: <1 m[IU]/mL (ref <5)

## 2018-02-05 LAB — TROPONIN I

## 2018-02-05 MED ORDER — KETOROLAC TROMETHAMINE 30 MG/ML IJ SOLN
30.0000 mg | Freq: Once | INTRAMUSCULAR | Status: AC
  Administered 2018-02-05: 30 mg via INTRAMUSCULAR
  Filled 2018-02-05 (×2): qty 1

## 2018-02-05 MED ORDER — TRAMADOL HCL 50 MG PO TABS: 50.0000 mg | ORAL_TABLET | Freq: Four times a day (QID) | ORAL | 0 refills | Status: DC | PRN

## 2018-02-05 MED ORDER — TRAMADOL HCL 50 MG PO TABS
50.0000 mg | ORAL_TABLET | Freq: Once | ORAL | Status: AC
  Administered 2018-02-05: 50 mg via ORAL
  Filled 2018-02-05: qty 1

## 2018-02-05 NOTE — ED Triage Notes (Signed)
Pt arrived with complaints of right sided sharp chest pain that has been intermittent for 3 months. Pt in NAD

## 2018-02-05 NOTE — ED Provider Notes (Signed)
[facility] Emergency Department Provider Note   ____________________________________________   First MD Initiated Contact with Patient 02/05/18 2036     (approximate)  I have reviewed the triage vital signs and the nursing notes.   HISTORY  Chief Complaint Chest Pain  HPI Maria Wallace is a 27 y.o. female presents for evaluation of chest pain   Patient reports to me that for about 3 months now she is experienced intermittent somewhat sharp chest pain underneath her breastbone.  She reports this seems to come about mostly at times and becomes upset with her children similar to how it did today.  She reports that frequently at night she notices an achiness below the breastbone, does not radiate.  No associated nausea vomiting or abdominal pain.  Denies pregnancy.  Not short of breath.  No leg swelling no recent trauma or injury.  Had a urinary tract infection treated a few days ago that seems to be much better.  No history of any blood clots.  No recent surgeries.  She does smoke and occasionally uses vaporizing product.  She reports she does not want any Tylenol or ibuprofen, she reports the work.  She reports no history of heart disease.  No strong family history of heart disease in young persons.  Past Medical History:  Diagnosis Date  . Depression     Patient Active Problem List   Diagnosis Date Noted  . Indication for care in labor and delivery, antepartum 09/10/2015    Past Surgical History:  Procedure Laterality Date  . MYRINGOTOMY      Prior to Admission medications   Medication Sig Start Date End Date Taking? Authorizing Provider  prochlorperazine (COMPAZINE) 10 MG tablet Take 1 tablet (10 mg total) by mouth every 6 (six) hours as needed for nausea. 01/15/16   Irean Hong, MD    Allergies Patient has no known allergies.  No family history on file.  Social History Social History   Tobacco Use  . Smoking status: Current Every  Day Smoker    Packs/day: 1.00    Types: Cigarettes  . Smokeless tobacco: Never Used  Substance Use Topics  . Alcohol use: Yes  . Drug use: No    Review of Systems Constitutional: No fever/chills Eyes: No visual changes. ENT: No sore throat. Cardiovascular: Denies chest pain which radiates across the neck or back, but rather sits just underneath her breastbone is rather sharp in intensity. Respiratory: Denies shortness of breath. Gastrointestinal: No abdominal pain.   Genitourinary: Negative for dysuria. Musculoskeletal: Negative for back pain. Skin: Negative for rash. Neurological: Negative for headaches, areas of focal weakness or numbness.    ____________________________________________   PHYSICAL EXAM:  VITAL SIGNS: ED Triage Vitals  Enc Vitals Group     BP 02/05/18 1907 111/65     Pulse Rate 02/05/18 1907 85     Resp 02/05/18 1907 18     Temp 02/05/18 1907 97.6 F (36.4 C)     Temp Source 02/05/18 1907 Oral     SpO2 02/05/18 1907 100 %     Weight 02/05/18 1908 190 lb (86.2 kg)     Height 02/05/18 1908 5\' 6"  (1.676 m)     Head Circumference --      Peak Flow --      Pain Score 02/05/18 1907 8     Pain Loc --      Pain Edu? --      Excl. in GC? --  Constitutional: Alert and oriented. Well appearing and in no acute distress.  She appears quite comfortable, does not appear in any distress. Eyes: Conjunctivae are normal. Head: Atraumatic. Nose: No congestion/rhinnorhea. Mouth/Throat: Mucous membranes are moist. Neck: No stridor.  Cardiovascular: Normal rate, regular rhythm. Grossly normal heart sounds.  Good peripheral circulation. Respiratory: Normal respiratory effort.  No retractions. Lungs CTAB. Gastrointestinal: Soft and nontender. No distention. Musculoskeletal: No lower extremity tenderness nor edema.  There is no tenderness to the lower thighs bilaterally, no venous cords or congestion. Neurologic:  Normal speech and language. No gross focal  neurologic deficits are appreciated.  Skin:  Skin is warm, dry and intact. No rash noted. Psychiatric: Mood and affect are normal. Speech and behavior are normal.  ____________________________________________   LABS (all labs ordered are listed, but only abnormal results are displayed)  Labs Reviewed  CBC - Abnormal; Notable for the following components:      Result Value   RBC 5.41 (*)    All other components within normal limits  BASIC METABOLIC PANEL  TROPONIN I  HCG, QUANTITATIVE, PREGNANCY  FIBRIN DERIVATIVES D-DIMER ([facility] ONLY)   ____________________________________________  EKG  ED ECG REPORT I, Sharyn Creamer, the attending physician, personally viewed and interpreted this ECG.  Date: 02/05/2018 EKG Time: 1905 Rate: 85 Rhythm: normal sinus rhythm QRS Axis: normal Intervals: normal ST/T Wave abnormalities: normal Narrative Interpretation: no evidence of acute ischemia.  Single T wave inversion in V3, nonspecific.  No evidence of ischemia.  ____________________________________________  RADIOLOGY  Dg Chest 2 View  Result Date: 02/05/2018 CLINICAL DATA:  Intermittent right chest pain for 3 months EXAM: CHEST - 2 VIEW COMPARISON:  None. FINDINGS: The heart size and mediastinal contours are within normal limits. Both lungs are clear. The visualized skeletal structures are unremarkable. IMPRESSION: No active cardiopulmonary disease. Electronically Signed   By: Alcide Clever M.D.   On: 02/05/2018 19:46     ____________________________________________   PROCEDURES  Procedure(s) performed:   Procedures  Critical Care performed: No  ____________________________________________   INITIAL IMPRESSION / ASSESSMENT AND PLAN / ED COURSE  Pertinent labs & imaging results that were available during my care of the patient were reviewed by me and considered in my medical decision making (see chart for details).   Differential diagnosis includes, but is not limited to,  ACS, aortic dissection, pulmonary embolism, cardiac tamponade, pneumothorax, pneumonia, pericarditis, myocarditis, GI-related causes including esophagitis/gastritis, and musculoskeletal chest wall pain.  Overall given the clinical presentation and chronicity of several months time of same symptoms, I do not see evidence of acute coronary syndrome.  Patient felt to be a low risk and her heart score is low risk.  Troponin normal.  Offered patient nonnarcotic pain medications, but she denied I proceeded to take aspirin, ibuprofen Tylenol or Toradol here.  She is low risk by Wells criteria for PE, no hypoxia, no tachycardia either.  We will send d-dimer as she does report being on some type of birth control but does not recall what it is, though she does report she has not taken out in about a week's time.  Patency test negative.  No signs or symptoms suggest acute pericarditis, myocarditis, ACS, PE or dissection.  I suspect at this time, likely musculoskeletal or potentially related to stressful situations she reports this stems to come about after she becomes upset with her children.  ----------------------------------------- 8:59 PM on 02/05/2018 -----------------------------------------  Ongoing care assigned to Dr. Marisa Severin, follow-up on d-dimer.  D-dimer negative, reevaluate and consider  discharge for low risk chest pain, likely musculoskeletal in nature.       ____________________________________________   FINAL CLINICAL IMPRESSION(S) / ED DIAGNOSES  Final diagnoses:  Acute nonspecific chest pain with low risk of coronary artery disease        Note:  This document was prepared using Dragon voice recognition software and may include unintentional dictation errors       Sharyn Creamer, MD 02/05/18 2101

## 2018-02-05 NOTE — Discharge Instructions (Addendum)

## 2018-02-05 NOTE — ED Provider Notes (Signed)
-----------------------------------------   10:58 PM on 02/05/2018 -----------------------------------------  I took over care on this patient from Dr. Fanny Bien.  The d-dimer is negative.  The patient's pain is improved.  She declined Toradol in the ED.  She is stable for discharge home at this time.  The patient is requesting something for pain.  Given her past medical history and the fact she is on mental health medication I want to avoid narcotic pain medications although I agreed to give a very limited supply of tramadol for her acute pain.  I counseled her that this is not appropriate for long-term pain management.  I gave the patient return precautions and she expressed understanding.   Dionne Bucy, MD 02/05/18 2300

## 2018-02-09 ENCOUNTER — Ambulatory Visit: Payer: Medicaid Other | Admitting: Advanced Practice Midwife

## 2018-02-23 ENCOUNTER — Ambulatory Visit: Payer: Medicaid Other | Admitting: Cardiology

## 2018-03-14 ENCOUNTER — Other Ambulatory Visit: Payer: Self-pay

## 2018-03-14 ENCOUNTER — Emergency Department
Admission: EM | Admit: 2018-03-14 | Discharge: 2018-03-14 | Discharge status: Against Medical Advice | Payer: Medicaid Other | Attending: Emergency Medicine | Admitting: Emergency Medicine

## 2018-03-14 DIAGNOSIS — R102 Pelvic and perineal pain: Secondary | ICD-10-CM | POA: Diagnosis not present

## 2018-03-14 DIAGNOSIS — Z5321 Procedure and treatment not carried out due to patient leaving prior to being seen by health care provider: Secondary | ICD-10-CM | POA: Diagnosis not present

## 2018-03-14 NOTE — ED Triage Notes (Signed)
Pt c/o R sided pelvic pain. Saw [facility] and [facility] last night. DC paperwork states UTI, pelvic pain, less than [redacted] weeks pregnant. Pt states she isn't pregnant. Pt signifcant other states that they didn't give her any pain medication or antibiotics, states UTI a few months ago and completed antibiotics then. States told a cyst. Unsure if ovarian cyst. [facility]. Also DC papers states BV, states received flagyl and keflex in ED. hcg 34.6 per DC papers.   Pt states R sided pelvic pain since having child in 2018 but worse over 3-4 days. States LMP oct 29th.   A&O, no distress noted.

## 2018-03-14 NOTE — ED Notes (Signed)
Spoke to Dr. Lenard LancePaduchowski about pt, no orders at this time.

## 2018-11-26 ENCOUNTER — Ambulatory Visit: Payer: Medicaid Other | Admitting: Podiatry

## 2018-12-03 ENCOUNTER — Ambulatory Visit: Payer: Medicaid Other | Admitting: Podiatry

## 2019-03-28 ENCOUNTER — Emergency Department
Admission: EM | Admit: 2019-03-28 | Discharge: 2019-03-28 | Disposition: A | Discharge status: Home / Self Care | Payer: Medicaid Other | Attending: Emergency Medicine | Admitting: Emergency Medicine

## 2019-03-28 ENCOUNTER — Other Ambulatory Visit: Payer: Self-pay

## 2019-03-28 ENCOUNTER — Encounter: Payer: Self-pay | Admitting: Emergency Medicine

## 2019-03-28 ENCOUNTER — Emergency Department: Payer: Medicaid Other

## 2019-03-28 DIAGNOSIS — R102 Pelvic and perineal pain: Secondary | ICD-10-CM | POA: Insufficient documentation

## 2019-03-28 DIAGNOSIS — Z87891 Personal history of nicotine dependence: Secondary | ICD-10-CM | POA: Insufficient documentation

## 2019-03-28 LAB — CBC WITH DIFFERENTIAL/PLATELET
Abs Immature Granulocytes: 0.04 10*3/uL (ref 0.00–0.07)
Basophils Absolute: 0.1 10*3/uL (ref 0.0–0.1)
Basophils Relative: 1 %
Eosinophils Absolute: 0.1 10*3/uL (ref 0.0–0.5)
Eosinophils Relative: 1 %
HCT: 39.5 % (ref 36.0–46.0)
Hemoglobin: 12.9 g/dL (ref 12.0–15.0)
Immature Granulocytes: 0 %
Lymphocytes Relative: 25 %
Lymphs Abs: 2.4 10*3/uL (ref 0.7–4.0)
MCH: 27.5 pg (ref 26.0–34.0)
MCHC: 32.7 g/dL (ref 30.0–36.0)
MCV: 84.2 fL (ref 80.0–100.0)
Monocytes Absolute: 0.4 10*3/uL (ref 0.1–1.0)
Monocytes Relative: 4 %
Neutro Abs: 6.8 10*3/uL (ref 1.7–7.7)
Neutrophils Relative %: 69 %
Platelets: 288 10*3/uL (ref 150–400)
RBC: 4.69 MIL/uL (ref 3.87–5.11)
RDW: 12.4 % (ref 11.5–15.5)
WBC: 9.8 10*3/uL (ref 4.0–10.5)
nRBC: 0 % (ref 0.0–0.2)

## 2019-03-28 LAB — URINALYSIS, COMPLETE (UACMP) WITH MICROSCOPIC
Bacteria, UA: NONE SEEN
Bilirubin Urine: NEGATIVE
Glucose, UA: NEGATIVE mg/dL
Hgb urine dipstick: NEGATIVE
Ketones, ur: NEGATIVE mg/dL
Leukocytes,Ua: NEGATIVE
Nitrite: NEGATIVE
Protein, ur: NEGATIVE mg/dL
Specific Gravity, Urine: 1.023 (ref 1.005–1.030)
pH: 5 (ref 5.0–8.0)

## 2019-03-28 LAB — POCT PREGNANCY, URINE: Preg Test, Ur: NEGATIVE

## 2019-03-28 LAB — COMPREHENSIVE METABOLIC PANEL
ALT: 22 U/L (ref 0–44)
AST: 23 U/L (ref 15–41)
Albumin: 4.4 g/dL (ref 3.5–5.0)
Alkaline Phosphatase: 95 U/L (ref 38–126)
Anion gap: 10 (ref 5–15)
BUN: 18 mg/dL (ref 6–20)
CO2: 19 mmol/L — ABNORMAL LOW (ref 22–32)
Calcium: 9.2 mg/dL (ref 8.9–10.3)
Chloride: 106 mmol/L (ref 98–111)
Creatinine, Ser: 0.76 mg/dL (ref 0.44–1.00)
GFR calc Af Amer: >60 mL/min (ref >60)
GFR calc non Af Amer: >60 mL/min (ref >60)
Glucose, Bld: 134 mg/dL — ABNORMAL HIGH (ref 70–99)
Potassium: 3.6 mmol/L (ref 3.5–5.1)
Sodium: 135 mmol/L (ref 135–145)
Total Bilirubin: 0.5 mg/dL (ref 0.3–1.2)
Total Protein: 7.7 g/dL (ref 6.5–8.1)

## 2019-03-28 LAB — LIPASE, BLOOD: Lipase: 21 U/L (ref 11–51)

## 2019-03-28 MED ORDER — ACETAMINOPHEN 500 MG PO TABS
1000.0000 mg | ORAL_TABLET | Freq: Once | ORAL | Status: AC
  Administered 2019-03-28: 1000 mg via ORAL
  Filled 2019-03-28: qty 2

## 2019-03-28 MED ORDER — KETOROLAC TROMETHAMINE 30 MG/ML IJ SOLN
30.0000 mg | Freq: Once | INTRAMUSCULAR | Status: AC
  Administered 2019-03-28: 30 mg via INTRAMUSCULAR
  Filled 2019-03-28: qty 1

## 2019-03-28 NOTE — ED Provider Notes (Signed)
[facility] Emergency Department Provider Note  ____________________________________________  Time seen: Approximately 10:21 PM  I have reviewed the triage vital signs and the nursing notes.   HISTORY  Chief Complaint Abdominal Pain    HPI Maria Wallace is a 28 y.o. female presents to the emergency department for treatment and evaluation of pelvic pain that has been present for the past 2-3 months.   She had her tubes tied in late September and has continued to have pelvic pain that was well treated with oxycodone. Pain has not changed in quality or location. She denies dysuria or vaginal dishcarge/bleeding. No relief with ibuprofen.   Past Medical History:  Diagnosis Date  . Depression     Patient Active Problem List   Diagnosis Date Noted  . Indication for care in labor and delivery, antepartum 09/10/2015    Past Surgical History:  Procedure Laterality Date  . MYRINGOTOMY      Prior to Admission medications   Medication Sig Start Date End Date Taking? Authorizing Provider  prochlorperazine (COMPAZINE) 10 MG tablet Take 1 tablet (10 mg total) by mouth every 6 (six) hours as needed for nausea. 01/15/16   Paulette Blanch, MD  traMADol (ULTRAM) 50 MG tablet Take 1 tablet (50 mg total) by mouth every 6 (six) hours as needed for severe pain. 02/05/18   Arta Silence, MD    Allergies Patient has no known allergies.  No family history on file.  Social History Social History   Tobacco Use  . Smoking status: Former Smoker    Packs/day: 1.00    Types: Cigarettes  . Smokeless tobacco: Never Used  Substance Use Topics  . Alcohol use: Yes  . Drug use: No    Review of Systems Constitutional: Negative for fever. Respiratory: Negative for shortness of breath or cough. Gastrointestinal: Positive for pelvic pain; positive for nausea , negative for vomiting. Genitourinary: Negative for dysuria , negative for vaginal discharge. Musculoskeletal:  Negative for back pain. Skin: Negative for acute skin changes/rash/lesion. ____________________________________________   PHYSICAL EXAM:  VITAL SIGNS: ED Triage Vitals [03/28/19 1930]  Enc Vitals Group     BP 115/69     Pulse Rate 79     Resp 18     Temp 98.7 F (37.1 C)     Temp Source Oral     SpO2 99 %     Weight 150 lb (68 kg)     Height 5\' 6"  (1.676 m)     Head Circumference      Peak Flow      Pain Score 9     Pain Loc      Pain Edu?      Excl. in [place]?     Constitutional: Alert and oriented. Well appearing and in no acute distress. Eyes: Conjunctivae are normal. Head: Atraumatic. Nose: No congestion/rhinnorhea. Mouth/Throat: Mucous membranes are moist. Respiratory: Normal respiratory effort.  No retractions. Gastrointestinal: Bowel sounds active x 4; Abdomen is soft without rebound or guarding. Tenderness to palpation over pelvic area bilaterally. Genitourinary: Pelvic exam: deferred Musculoskeletal: No extremity tenderness nor edema.  Neurologic:  Normal speech and language. No gross focal neurologic deficits are appreciated. Speech is normal. No gait instability. Skin:  Skin is warm, dry and intact. No rash noted on exposed skin. Psychiatric: Mood and affect are normal. Speech and behavior are normal.  ____________________________________________   LABS (all labs ordered are listed, but only abnormal results are displayed)  Labs Reviewed  COMPREHENSIVE METABOLIC PANEL -  Abnormal; Notable for the following components:      Result Value   CO2 19 (*)    Glucose, Bld 134 (*)    All other components within normal limits  URINALYSIS, COMPLETE (UACMP) WITH MICROSCOPIC - Abnormal; Notable for the following components:   Color, Urine YELLOW (*)    APPearance CLEAR (*)    All other components within normal limits  CBC WITH DIFFERENTIAL/PLATELET  LIPASE, BLOOD  POCT PREGNANCY, URINE   ____________________________________________  RADIOLOGY  Pelvic ultrasound  is negative for acute findings per radiology. ____________________________________________  Procedures  ____________________________________________  28 year old female presenting to the emergency department for treatment and evaluation of pelvic pain that has been ongoing since the end of September.  She has been evaluated by OB/GYN for this complaint.  She has been refused additional refills of her oxycodone.  Her controlled substance report shows that her overdose risk score is 420.  While here, she was given Toradol which she states did not help.  Ultrasound does not show any acute abnormalities.  Labs and urinalysis are all reassuring.  Patient states that she was supposed to receive a referral for pain management but has not received a call.  She was advised that she should continue taking ibuprofen and Tylenol and call her primary care provider or the person who was supposed to give the referral.  She was advised that the emergency department does not manage chronic pain with narcotic medications.  She is discharged home in stable conditions.  Vital signs are reassuring.  INITIAL IMPRESSION / ASSESSMENT AND PLAN / ED COURSE  Pertinent labs & imaging results that were available during my care of the patient were reviewed by me and considered in my medical decision making (see chart for details).  ____________________________________________   FINAL CLINICAL IMPRESSION(S) / ED DIAGNOSES  Final diagnoses:  Pelvic pain    Note:  This document was prepared using Dragon voice recognition software and may include unintentional dictation errors.   Chinita Pester, FNP 03/28/19 2236    Phineas Semen, MD 03/28/19 [phone]

## 2019-03-28 NOTE — ED Notes (Signed)
PA made aware of pt's pain and request for pain medication.

## 2019-03-28 NOTE — Discharge Instructions (Addendum)
Your tests are normal including your ultrasound. I have not found any reason for your pelvic pain. You will have to follow up with your primary care provider or make arrangements for pain management. The Emergency Department does not manage chronic pain with narcotic medications.

## 2019-03-28 NOTE — ED Notes (Signed)
Signature pad not working, attempted multiple times.  Discharge paperwork reviewed and questions answered.  Patient denies other questions or needs at this time.  Pt given several referrals to [facility], [facility], [facility], and [facility] if needed for primary care follow up.  Patient given explanation in length about pain management, especially chronic pain, done in the emergency department.

## 2019-03-28 NOTE — ED Triage Notes (Signed)
Patient ambulatory to triage with steady gait, without difficulty or distress noted, mask in place; pt reports lower abd pain x 30mos; has been seen for same and referred to pain clinic with no dx; pt denies any accomp symptoms

## 2019-12-05 ENCOUNTER — Encounter: Payer: Self-pay | Admitting: Intensive Care

## 2019-12-05 ENCOUNTER — Emergency Department
Admission: EM | Admit: 2019-12-05 | Discharge: 2019-12-05 | Disposition: A | Discharge status: Home / Self Care | Payer: Medicaid Other | Attending: Emergency Medicine | Admitting: Emergency Medicine

## 2019-12-05 ENCOUNTER — Other Ambulatory Visit: Payer: Self-pay

## 2019-12-05 DIAGNOSIS — L03115 Cellulitis of right lower limb: Secondary | ICD-10-CM | POA: Insufficient documentation

## 2019-12-05 DIAGNOSIS — F17200 Nicotine dependence, unspecified, uncomplicated: Secondary | ICD-10-CM | POA: Diagnosis not present

## 2019-12-05 DIAGNOSIS — M25561 Pain in right knee: Secondary | ICD-10-CM | POA: Diagnosis present

## 2019-12-05 DIAGNOSIS — L03119 Cellulitis of unspecified part of limb: Secondary | ICD-10-CM

## 2019-12-05 HISTORY — DX: Personality disorder, unspecified: F60.9

## 2019-12-05 HISTORY — DX: Bipolar disorder, unspecified: F31.9

## 2019-12-05 HISTORY — DX: Anxiety disorder, unspecified: F41.9

## 2019-12-05 LAB — CBC WITH DIFFERENTIAL/PLATELET
Abs Immature Granulocytes: 0.02 10*3/uL (ref 0.00–0.07)
Basophils Absolute: 0.1 10*3/uL (ref 0.0–0.1)
Basophils Relative: 1 %
Eosinophils Absolute: 0.1 10*3/uL (ref 0.0–0.5)
Eosinophils Relative: 2 %
HCT: 34.5 % — ABNORMAL LOW (ref 36.0–46.0)
Hemoglobin: 11.7 g/dL — ABNORMAL LOW (ref 12.0–15.0)
Immature Granulocytes: 0 %
Lymphocytes Relative: 34 %
Lymphs Abs: 2.8 10*3/uL (ref 0.7–4.0)
MCH: 27.5 pg (ref 26.0–34.0)
MCHC: 33.9 g/dL (ref 30.0–36.0)
MCV: 81 fL (ref 80.0–100.0)
Monocytes Absolute: 0.5 10*3/uL (ref 0.1–1.0)
Monocytes Relative: 6 %
Neutro Abs: 4.7 10*3/uL (ref 1.7–7.7)
Neutrophils Relative %: 57 %
Platelets: 225 10*3/uL (ref 150–400)
RBC: 4.26 MIL/uL (ref 3.87–5.11)
RDW: 13.6 % (ref 11.5–15.5)
WBC: 8.2 10*3/uL (ref 4.0–10.5)
nRBC: 0 % (ref 0.0–0.2)

## 2019-12-05 LAB — COMPREHENSIVE METABOLIC PANEL
ALT: 13 U/L (ref 0–44)
AST: 15 U/L (ref 15–41)
Albumin: 3.8 g/dL (ref 3.5–5.0)
Alkaline Phosphatase: 84 U/L (ref 38–126)
Anion gap: 4 — ABNORMAL LOW (ref 5–15)
BUN: 10 mg/dL (ref 6–20)
CO2: 28 mmol/L (ref 22–32)
Calcium: 8.7 mg/dL — ABNORMAL LOW (ref 8.9–10.3)
Chloride: 107 mmol/L (ref 98–111)
Creatinine, Ser: 0.64 mg/dL (ref 0.44–1.00)
GFR calc Af Amer: >60 mL/min (ref >60)
GFR calc non Af Amer: >60 mL/min (ref >60)
Glucose, Bld: 95 mg/dL (ref 70–99)
Potassium: 4 mmol/L (ref 3.5–5.1)
Sodium: 139 mmol/L (ref 135–145)
Total Bilirubin: 0.3 mg/dL (ref 0.3–1.2)
Total Protein: 6.6 g/dL (ref 6.5–8.1)

## 2019-12-05 MED ORDER — CLINDAMYCIN HCL 300 MG PO CAPS: 300.0000 mg | ORAL_CAPSULE | Freq: Four times a day (QID) | ORAL | 0 refills | Status: AC

## 2019-12-05 MED ORDER — TRAMADOL HCL 50 MG PO TABS: 50.0000 mg | ORAL_TABLET | Freq: Four times a day (QID) | ORAL | 0 refills | Status: AC | PRN

## 2019-12-05 MED ORDER — CLINDAMYCIN PHOSPHATE 600 MG/50ML IV SOLN
600.0000 mg | Freq: Once | INTRAVENOUS | Status: AC
  Administered 2019-12-05: 600 mg via INTRAVENOUS
  Filled 2019-12-05: qty 50

## 2019-12-05 MED ORDER — HYDROCODONE-ACETAMINOPHEN 5-325 MG PO TABS
1.0000 | ORAL_TABLET | Freq: Once | ORAL | Status: AC
  Administered 2019-12-05: 1 via ORAL
  Filled 2019-12-05: qty 1

## 2019-12-05 NOTE — ED Provider Notes (Signed)
Emergency Department Provider Note  ____________________________________________  Time seen: Approximately 7:19 PM  I have reviewed the triage vital signs and the nursing notes.   HISTORY  Chief Complaint Knee Pain (right)   Historian Patient     HPI Maria Wallace is a 29 y.o. female presents to the emergency department with acute right knee pain.  Patient states that approximately 1 week ago, she had what appeared to be a small "bump" appear along the anterior aspect of the right knee.  Patient states that lesion became larger without expression of purulent exudate.  She noticed acute swelling of the right knee and worsening pain.  She denies fever and chills at home.  States that she has no history of cutaneous abscesses and has never experienced similar symptoms in the past.  No other alleviating measures have been attempted.    Past Medical History:  Diagnosis Date  . Anxiety   . Bipolar 1 disorder (HCC)   . Depression   . Personality disorder (HCC)      Immunizations up to date:  Yes.     Past Medical History:  Diagnosis Date  . Anxiety   . Bipolar 1 disorder (HCC)   . Depression   . Personality disorder [facility])     Patient Active Problem List   Diagnosis Date Noted  . Indication for care in labor and delivery, antepartum 09/10/2015    Past Surgical History:  Procedure Laterality Date  . MYRINGOTOMY    . TUBAL LIGATION      Prior to Admission medications   Medication Sig Start Date End Date Taking? Authorizing Provider  clindamycin (CLEOCIN) 300 MG capsule Take 1 capsule (300 mg total) by mouth 4 (four) times daily for 10 days. 12/05/19 12/15/19  Orvil Feil, PA-C  prochlorperazine (COMPAZINE) 10 MG tablet Take 1 tablet (10 mg total) by mouth every 6 (six) hours as needed for nausea. 01/15/16   Irean Hong, MD  traMADol (ULTRAM) 50 MG tablet Take 1 tablet (50 mg total) by mouth every 6 (six) hours as needed for up to 3 days. 12/05/19 12/08/19  Orvil Feil, PA-C    Allergies Patient has no known allergies.  History reviewed. No pertinent family history.  Social History Social History   Tobacco Use  . Smoking status: Current Every Day Smoker    Packs/day: 1.00    Types: E-cigarettes  . Smokeless tobacco: Never Used  Vaping Use  . Vaping Use: Every day  Substance Use Topics  . Alcohol use: Yes    Comment: occ monthly  . Drug use: No     Review of Systems  Constitutional: No fever/chills Eyes:  No discharge ENT: No upper respiratory complaints. Respiratory: no cough. No SOB/ use of accessory muscles to breath Gastrointestinal:   No nausea, no vomiting.  No diarrhea.  No constipation. Musculoskeletal: Patient has right knee pain.  Skin: Negative for rash, abrasions, lacerations, ecchymosis.    ____________________________________________   PHYSICAL EXAM:  VITAL SIGNS: ED Triage Vitals  Enc Vitals Group     BP 12/05/19 1739 132/76     Pulse Rate 12/05/19 1739 70     Resp 12/05/19 1739 16     Temp 12/05/19 1739 98.6 F (37 C)     Temp Source 12/05/19 1739 Oral     SpO2 12/05/19 1739 98 %     Weight 12/05/19 1758 160 lb (72.6 kg)     Height 12/05/19 1758 5\' 6"  (1.676 m)  Head Circumference --      Peak Flow --      Pain Score 12/05/19 1758 7     Pain Loc --      Pain Edu? --      Excl. in [place]? --      Constitutional: Alert and oriented. Well appearing and in no acute distress. Eyes: Conjunctivae are normal. PERRL. EOMI. Head: Atraumatic. ENT:      Nose: No congestion/rhinnorhea.      Mouth/Throat: Mucous membranes are moist.  Neck: No stridor.  No cervical spine tenderness to palpation. Cardiovascular: Normal rate, regular rhythm. Normal S1 and S2.  Good peripheral circulation. Respiratory: Normal respiratory effort without tachypnea or retractions. Lungs CTAB. Good air entry to the bases with no decreased or absent breath sounds Gastrointestinal: Bowel sounds x 4 quadrants. Soft and nontender  to palpation. No guarding or rigidity. No distention. Musculoskeletal: Patient demonstrates full range of motion at the right knee. Neurologic:  Normal for age. No gross focal neurologic deficits are appreciated.  Skin: Patient has a 2 cm x 2 cm region of erythema overlying the right knee with no associated induration or palpable fluctuance. Psychiatric: Mood and affect are normal for age. Speech and behavior are normal.   ____________________________________________   LABS (all labs ordered are listed, but only abnormal results are displayed)  Labs Reviewed  CBC WITH DIFFERENTIAL/PLATELET - Abnormal; Notable for the following components:      Result Value   Hemoglobin 11.7 (*)    HCT 34.5 (*)    All other components within normal limits  COMPREHENSIVE METABOLIC PANEL - Abnormal; Notable for the following components:   Calcium 8.7 (*)    Anion gap 4 (*)    All other components within normal limits   ____________________________________________  EKG   ____________________________________________  RADIOLOGY  No results found.  ____________________________________________    PROCEDURES  Procedure(s) performed:     Procedures     Medications  clindamycin (CLEOCIN) IVPB 600 mg (0 mg Intravenous Stopped 12/05/19 1904)  HYDROcodone-acetaminophen (NORCO/VICODIN) 5-325 MG per tablet 1 tablet (1 tablet Oral Given 12/05/19 2032)     ____________________________________________   INITIAL IMPRESSION / ASSESSMENT AND PLAN / ED COURSE  Pertinent labs & imaging results that were available during my care of the patient were reviewed by me and considered in my medical decision making (see chart for details).      Assessment and plan Cellulitis 29 year old female presents to the emergency department with acute right knee pain.  On physical exam, patient had a 2 cm x 2 cm region of erythema with no associated induration or palpable fluctuance.  CBC was reassuring  without leukocytosis.  Patient was given IV clindamycin in the emergency department.  She was discharged with clindamycin.  Return precautions were given to return with new or worsening symptoms.    ____________________________________________  FINAL CLINICAL IMPRESSION(S) / ED DIAGNOSES  Final diagnoses:  Cellulitis of knee      NEW MEDICATIONS STARTED DURING THIS VISIT:  ED Discharge Orders         Ordered    clindamycin (CLEOCIN) 300 MG capsule  4 times daily        12/05/19 2019    traMADol (ULTRAM) 50 MG tablet  Every 6 hours PRN        12/05/19 2030              This chart was dictated using voice recognition software/Dragon. Despite best efforts to proofread, errors can  occur which can change the meaning. Any change was purely unintentional.     Gasper Lloyd 12/05/19 2037    Dionne Bucy, MD 12/05/19 [phone]

## 2019-12-05 NOTE — ED Notes (Signed)
ED Provider at bedside. 

## 2019-12-05 NOTE — ED Triage Notes (Signed)
Patient c/o right knee pain.

## 2019-12-05 NOTE — Discharge Instructions (Signed)
Take clindamycin 4 times daily for the next 10 days. Return to the emergency department with new or worsening symptoms.

## 2019-12-08 ENCOUNTER — Emergency Department
Admission: EM | Admit: 2019-12-08 | Discharge: 2019-12-08 | Disposition: A | Discharge status: Home / Self Care | Payer: Medicaid Other | Attending: Emergency Medicine | Admitting: Emergency Medicine

## 2019-12-08 ENCOUNTER — Other Ambulatory Visit: Payer: Self-pay

## 2019-12-08 ENCOUNTER — Encounter: Payer: Self-pay | Admitting: Emergency Medicine

## 2019-12-08 DIAGNOSIS — L02415 Cutaneous abscess of right lower limb: Secondary | ICD-10-CM | POA: Insufficient documentation

## 2019-12-08 DIAGNOSIS — M25561 Pain in right knee: Secondary | ICD-10-CM | POA: Diagnosis present

## 2019-12-08 DIAGNOSIS — F17210 Nicotine dependence, cigarettes, uncomplicated: Secondary | ICD-10-CM | POA: Diagnosis not present

## 2019-12-08 DIAGNOSIS — M799 Soft tissue disorder, unspecified: Secondary | ICD-10-CM

## 2019-12-08 MED ORDER — NAPROXEN 500 MG PO TABS: 500.0000 mg | ORAL_TABLET | Freq: Two times a day (BID) | ORAL | 2 refills | Status: DC

## 2019-12-08 MED ORDER — LIDOCAINE-EPINEPHRINE-TETRACAINE (LET) TOPICAL GEL
3.0000 mL | Freq: Once | TOPICAL | Status: AC
  Administered 2019-12-08: 3 mL via TOPICAL
  Filled 2019-12-08: qty 3

## 2019-12-08 MED ORDER — NAPROXEN 500 MG PO TABS
500.0000 mg | ORAL_TABLET | Freq: Once | ORAL | Status: AC
  Administered 2019-12-08: 500 mg via ORAL
  Filled 2019-12-08: qty 1

## 2019-12-08 NOTE — ED Provider Notes (Signed)
[facility] Emergency Department Provider Note   ____________________________________________    I have reviewed the triage vital signs and the nursing notes.   HISTORY  Chief Complaint Knee Pain     HPI Maria Wallace is a 29 y.o. female with history as noted below who was seen here on 30 August for possible boil to the knee presents for recheck.  She reports no significant worsening or improvement, has been taking antibiotics.  No fevers or chills.  Mild pain with pressure to the area.  No pain with flexion of knee joint.  Past Medical History:  Diagnosis Date  . Anxiety   . Bipolar 1 disorder (HCC)   . Depression   . Personality disorder [facility])     Patient Active Problem List   Diagnosis Date Noted  . Indication for care in labor and delivery, antepartum 09/10/2015    Past Surgical History:  Procedure Laterality Date  . MYRINGOTOMY    . TUBAL LIGATION      Prior to Admission medications   Medication Sig Start Date End Date Taking? Authorizing Provider  clindamycin (CLEOCIN) 300 MG capsule Take 1 capsule (300 mg total) by mouth 4 (four) times daily for 10 days. 12/05/19 12/15/19  Orvil Feil, PA-C  naproxen (NAPROSYN) 500 MG tablet Take 1 tablet (500 mg total) by mouth 2 (two) times daily with a meal. 12/08/19   Jene Every, MD  prochlorperazine (COMPAZINE) 10 MG tablet Take 1 tablet (10 mg total) by mouth every 6 (six) hours as needed for nausea. 01/15/16   Irean Hong, MD  traMADol (ULTRAM) 50 MG tablet Take 1 tablet (50 mg total) by mouth every 6 (six) hours as needed for up to 3 days. 12/05/19 12/08/19  Orvil Feil, PA-C     Allergies Patient has no known allergies.  No family history on file.  Social History Social History   Tobacco Use  . Smoking status: Current Every Day Smoker    Packs/day: 1.00    Types: E-cigarettes  . Smokeless tobacco: Never Used  Vaping Use  . Vaping Use: Every day  Substance Use Topics  .  Alcohol use: Yes    Comment: occ monthly  . Drug use: No    Review of Systems  Constitutional: No fever/chills         Musculoskeletal: As above Skin: As above     ____________________________________________   PHYSICAL EXAM:  VITAL SIGNS: ED Triage Vitals  Enc Vitals Group     BP 12/08/19 1501 113/75     Pulse Rate 12/08/19 1501 81     Resp 12/08/19 1501 17     Temp --      Temp src --      SpO2 12/08/19 1501 98 %     Weight 12/08/19 1224 73 kg (161 lb)     Height 12/08/19 1224 1.676 m (5\' 6" )     Head Circumference --      Peak Flow --      Pain Score 12/08/19 1223 7     Pain Loc --      Pain Edu? --      Excl. in [place]? --      Constitutional: Alert and oriented. No acute distress  Nose: No congestion/rhinnorhea. Mouth/Throat: Mucous membranes are moist.   Cardiovascular: Normal rate, regular rhythm.  Respiratory: Normal respiratory effort.  No retractions.  Musculoskeletal: Right knee: Has small approximately 1 x 2 cm lesion no significant fluctuance,  no pointing and has not come to ahead, very mild erythema atypical presentation if this is an abscess.  No surrounding erythema to suggest worsening cellulitis Neurologic:  Normal speech and language. No gross focal neurologic deficits are appreciated.   Skin:  Skin is warm, dry and intact.   ____________________________________________   LABS (all labs ordered are listed, but only abnormal results are displayed)  Labs Reviewed - No data to display ____________________________________________  EKG   ____________________________________________  RADIOLOGY   ____________________________________________   PROCEDURES  Procedure(s) performed: yes  .Marland KitchenIncision and Drainage  Date/Time: 12/08/2019 3:21 PM Performed by: Jene Every, MD Authorized by: Jene Every, MD   Consent:    Consent obtained:  Verbal   Consent given by:  Patient   Risks discussed:  Bleeding, infection, incomplete  drainage and pain   Alternatives discussed:  Alternative treatment, delayed treatment and observation Location:    Type:  Abscess   Location:  Lower extremity   Lower extremity location:  Knee   Knee location:  R knee Pre-procedure details:    Skin preparation:  Antiseptic wash Anesthesia (see MAR for exact dosages):    Anesthesia method:  Topical application Procedure type:    Complexity:  Complex Procedure details:    Incision types:  Single straight   Incision depth:  Dermal   Scalpel blade:  11   Drainage:  Bloody   Drainage amount:  Scant   Wound treatment:  Wound left open   Packing materials:  None Post-procedure details:    Patient tolerance of procedure:  Tolerated well, no immediate complications     Critical Care performed: No ____________________________________________   INITIAL IMPRESSION / ASSESSMENT AND PLAN / ED COURSE  Pertinent labs & imaging results that were available during my care of the patient were reviewed by me and considered in my medical decision making (see chart for details).   Discussed with patient that unclear if this is an abscess, offered I&D versus continuing antibiotics, patient requested I&D.  Small mount of bloody drainage, no purulent drainage.  We will continue antibiotics outpatient follow-up   ____________________________________________   FINAL CLINICAL IMPRESSION(S) / ED DIAGNOSES  Final diagnoses:  Soft tissue lesion of knee region      NEW MEDICATIONS STARTED DURING THIS VISIT:  Discharge Medication List as of 12/08/2019  2:45 PM    START taking these medications   Details  naproxen (NAPROSYN) 500 MG tablet Take 1 tablet (500 mg total) by mouth 2 (two) times daily with a meal., Starting Thu 12/08/2019, Normal         Note:  This document was prepared using Dragon voice recognition software and may include unintentional dictation errors.   Jene Every, MD 12/08/19 [phone]

## 2019-12-08 NOTE — ED Triage Notes (Signed)
Pt reports was seen here 2 days ago and given abx for an infection on her right knee. Pt reports is here for a follow up but states she is not sure the abx are working

## 2020-01-02 ENCOUNTER — Ambulatory Visit: Payer: Medicaid Other | Admitting: Physician Assistant

## 2020-06-26 ENCOUNTER — Ambulatory Visit: Payer: Medicaid Other | Admitting: Podiatry

## 2020-09-13 ENCOUNTER — Ambulatory Visit: Payer: Medicaid Other | Admitting: Podiatry

## 2020-09-18 ENCOUNTER — Ambulatory Visit: Payer: Medicaid Other | Admitting: Podiatry

## 2021-01-03 ENCOUNTER — Emergency Department: Payer: Medicaid Other

## 2021-01-03 ENCOUNTER — Emergency Department
Admission: EM | Admit: 2021-01-03 | Discharge: 2021-01-03 | Disposition: A | Discharge status: Home / Self Care | Payer: Medicaid Other | Attending: Emergency Medicine | Admitting: Emergency Medicine

## 2021-01-03 ENCOUNTER — Other Ambulatory Visit: Payer: Self-pay

## 2021-01-03 DIAGNOSIS — M79661 Pain in right lower leg: Secondary | ICD-10-CM | POA: Insufficient documentation

## 2021-01-03 DIAGNOSIS — L03115 Cellulitis of right lower limb: Secondary | ICD-10-CM

## 2021-01-03 DIAGNOSIS — M25561 Pain in right knee: Secondary | ICD-10-CM | POA: Insufficient documentation

## 2021-01-03 LAB — URINE DRUG SCREEN, QUALITATIVE (ARMC ONLY)
Amphetamines, Ur Screen: NOT DETECTED
Barbiturates, Ur Screen: NOT DETECTED
Benzodiazepine, Ur Scrn: POSITIVE — AB
Cannabinoid 50 Ng, Ur ~~LOC~~: NOT DETECTED
Cocaine Metabolite,Ur ~~LOC~~: NOT DETECTED
MDMA (Ecstasy)Ur Screen: NOT DETECTED
Methadone Scn, Ur: NOT DETECTED
Opiate, Ur Screen: NOT DETECTED
Phencyclidine (PCP) Ur S: NOT DETECTED
Tricyclic, Ur Screen: NOT DETECTED

## 2021-01-03 LAB — POC URINE PREG, ED: Preg Test, Ur: NEGATIVE

## 2021-01-03 MED ORDER — CEPHALEXIN 500 MG PO CAPS
500.0000 mg | ORAL_CAPSULE | Freq: Once | ORAL | Status: AC
  Administered 2021-01-03: 500 mg via ORAL
  Filled 2021-01-03: qty 1

## 2021-01-03 MED ORDER — DOXYCYCLINE HYCLATE 100 MG PO TABS
100.0000 mg | ORAL_TABLET | Freq: Once | ORAL | Status: AC
  Administered 2021-01-03: 100 mg via ORAL
  Filled 2021-01-03: qty 1

## 2021-01-03 MED ORDER — DOXYCYCLINE MONOHYDRATE 100 MG PO TABS: 100.0000 mg | ORAL_TABLET | Freq: Two times a day (BID) | ORAL | 0 refills | Status: AC

## 2021-01-03 MED ORDER — CEPHALEXIN 500 MG PO CAPS: 500.0000 mg | ORAL_CAPSULE | Freq: Four times a day (QID) | ORAL | 0 refills | Status: AC

## 2021-01-03 NOTE — ED Provider Notes (Signed)
HPI: Pt is a 30 y.o. female who presents with complaints of leg pain   The patient p/w  right calf and knee pain. Some small sores that are itching she said. Says hard to walk due to pain.   ROS: Denies fever, chest pain, vomiting  Past Medical History:  Diagnosis Date   Anxiety    Bipolar 1 disorder (HCC)    Depression    Personality disorder (HCC)    Vitals:   01/03/21 1711  BP: 112/65  Pulse: 90  Resp: 18  Temp: 98.4 F (36.9 C)  SpO2: 99%    Focused Physical Exam: Gen: No acute distress Head: atraumatic, normocephalic Eyes: Extraocular movements grossly intact; conjunctiva clear CV: RRR Lung: No increased WOB, no stridor GI: ND, no obvious masses Neuro: Alert and awake Leg with rash notes and pain in the knee and calf.   Medical Decision Making and Plan: Given the patient's initial medical screening exam, the following diagnostic evaluation has been ordered. The patient will be placed in the appropriate treatment space, once one is available, to complete the evaluation and treatment. I have discussed the plan of care with the patient and I have advised the patient that an ED physician or mid-level practitioner will reevaluate their condition after the test results have been received, as the results may give them additional insight into the type of treatment they may need.   Diagnostics:us/xray   Treatments: none immediately   Concha Se, MD 01/03/21 [phone]

## 2021-01-03 NOTE — ED Triage Notes (Signed)
First Nurse Note:  C/O leg swelling.  AAOx3.  Skin warm and dry. NAD

## 2021-01-03 NOTE — Discharge Instructions (Signed)
Take Keflex four times daily for seven days. Take Doxycycline twice daily for seven days.

## 2021-01-03 NOTE — ED Triage Notes (Signed)
Pt here with right leg pain x 3d ays. Pt states that the leg is hot and severely painful compared to the left leg. Pt denies injury or sitting for along period of time.

## 2021-02-15 ENCOUNTER — Other Ambulatory Visit: Payer: Self-pay

## 2021-02-15 ENCOUNTER — Emergency Department
Admission: EM | Admit: 2021-02-15 | Discharge: 2021-02-15 | Disposition: A | Discharge status: Home / Self Care | Payer: Medicaid Other | Attending: Emergency Medicine | Admitting: Emergency Medicine

## 2021-02-15 ENCOUNTER — Emergency Department: Payer: Medicaid Other

## 2021-02-15 DIAGNOSIS — R220 Localized swelling, mass and lump, head: Secondary | ICD-10-CM | POA: Diagnosis present

## 2021-02-15 DIAGNOSIS — F17210 Nicotine dependence, cigarettes, uncomplicated: Secondary | ICD-10-CM | POA: Diagnosis not present

## 2021-02-15 DIAGNOSIS — L03213 Periorbital cellulitis: Secondary | ICD-10-CM | POA: Diagnosis not present

## 2021-02-15 LAB — CBC WITH DIFFERENTIAL/PLATELET
Abs Immature Granulocytes: 0.01 10*3/uL (ref 0.00–0.07)
Basophils Absolute: 0.1 10*3/uL (ref 0.0–0.1)
Basophils Relative: 1 %
Eosinophils Absolute: 0.2 10*3/uL (ref 0.0–0.5)
Eosinophils Relative: 3 %
HCT: 34.5 % — ABNORMAL LOW (ref 36.0–46.0)
Hemoglobin: 11.2 g/dL — ABNORMAL LOW (ref 12.0–15.0)
Immature Granulocytes: 0 %
Lymphocytes Relative: 34 %
Lymphs Abs: 1.8 10*3/uL (ref 0.7–4.0)
MCH: 26.7 pg (ref 26.0–34.0)
MCHC: 32.5 g/dL (ref 30.0–36.0)
MCV: 82.3 fL (ref 80.0–100.0)
Monocytes Absolute: 0.5 10*3/uL (ref 0.1–1.0)
Monocytes Relative: 9 %
Neutro Abs: 2.8 10*3/uL (ref 1.7–7.7)
Neutrophils Relative %: 53 %
Platelets: 219 10*3/uL (ref 150–400)
RBC: 4.19 MIL/uL (ref 3.87–5.11)
RDW: 13.9 % (ref 11.5–15.5)
WBC: 5.3 10*3/uL (ref 4.0–10.5)
nRBC: 0 % (ref 0.0–0.2)

## 2021-02-15 LAB — COMPREHENSIVE METABOLIC PANEL
ALT: 22 U/L (ref 0–44)
AST: 23 U/L (ref 15–41)
Albumin: 3.8 g/dL (ref 3.5–5.0)
Alkaline Phosphatase: 99 U/L (ref 38–126)
Anion gap: 5 (ref 5–15)
BUN: 14 mg/dL (ref 6–20)
CO2: 26 mmol/L (ref 22–32)
Calcium: 8.7 mg/dL — ABNORMAL LOW (ref 8.9–10.3)
Chloride: 107 mmol/L (ref 98–111)
Creatinine, Ser: 0.66 mg/dL (ref 0.44–1.00)
GFR, Estimated: >60 mL/min (ref >60)
Glucose, Bld: 94 mg/dL (ref 70–99)
Potassium: 3.3 mmol/L — ABNORMAL LOW (ref 3.5–5.1)
Sodium: 138 mmol/L (ref 135–145)
Total Bilirubin: 0.6 mg/dL (ref 0.3–1.2)
Total Protein: 7.1 g/dL (ref 6.5–8.1)

## 2021-02-15 MED ORDER — CLINDAMYCIN PHOSPHATE 600 MG/50ML IV SOLN
600.0000 mg | Freq: Once | INTRAVENOUS | Status: AC
  Administered 2021-02-15: 600 mg via INTRAVENOUS
  Filled 2021-02-15: qty 50

## 2021-02-15 MED ORDER — ONDANSETRON 4 MG PO TBDP
4.0000 mg | ORAL_TABLET | Freq: Once | ORAL | Status: AC
  Administered 2021-02-15: 4 mg via ORAL
  Filled 2021-02-15: qty 1

## 2021-02-15 MED ORDER — IOHEXOL 300 MG/ML  SOLN
75.0000 mL | Freq: Once | INTRAMUSCULAR | Status: AC | PRN
  Administered 2021-02-15: 75 mL via INTRAVENOUS
  Filled 2021-02-15: qty 75

## 2021-02-15 MED ORDER — ACETAMINOPHEN 325 MG PO TABS
650.0000 mg | ORAL_TABLET | Freq: Once | ORAL | Status: AC
  Administered 2021-02-15: 650 mg via ORAL
  Filled 2021-02-15: qty 2

## 2021-02-15 MED ORDER — CLINDAMYCIN HCL 300 MG PO CAPS
300.0000 mg | ORAL_CAPSULE | Freq: Three times a day (TID) | ORAL | 0 refills | Status: AC
Start: 2021-02-15 — End: 2021-02-25

## 2021-02-15 MED ORDER — AMOXICILLIN-POT CLAVULANATE 875-125 MG PO TABS: 1.0000 | ORAL_TABLET | Freq: Two times a day (BID) | ORAL | 0 refills | Status: AC

## 2021-02-15 MED ORDER — OXYCODONE-ACETAMINOPHEN 5-325 MG PO TABS
1.0000 | ORAL_TABLET | Freq: Once | ORAL | Status: AC
  Administered 2021-02-15: 1 via ORAL
  Filled 2021-02-15: qty 1

## 2021-02-15 NOTE — Discharge Instructions (Addendum)
Take Clindamycin three times daily for the next ten days. Take Augmentin twice daily for the next ten days.

## 2021-02-15 NOTE — ED Triage Notes (Signed)
Pt to ED for waking up with swelling to left side of face, c/o left sided dental pain that started a few days ago as well. Minimal swelling noted around left eye.  No shob, RR even and unlabored.

## 2021-02-15 NOTE — ED Provider Notes (Signed)
[facility]-EMERGENCY DEPARTMENT  ____________________________________________  Time seen: Approximately 4:30 PM  I have reviewed the triage vital signs and the nursing notes.   HISTORY  Chief Complaint Facial Swelling   Historian Patient    HPI Maria Wallace is a 30 y.o. female presents to the emergency department with left sided facial swelling and periorbital edema and erythema.  Patient states is difficult for her to open her left eye and she has pain with extraocular eye muscle movement.  She denies experiencing similar symptoms in the past.  No fever or chills.  Past Medical History:  Diagnosis Date   Anxiety    Bipolar 1 disorder (HCC)    Depression    Personality disorder (HCC)      Immunizations up to date:  Yes.     Past Medical History:  Diagnosis Date   Anxiety    Bipolar 1 disorder (HCC)    Depression    Personality disorder [facility])     Patient Active Problem List   Diagnosis Date Noted   Indication for care in labor and delivery, antepartum 09/10/2015    Past Surgical History:  Procedure Laterality Date   MYRINGOTOMY     TUBAL LIGATION      Prior to Admission medications   Medication Sig Start Date End Date Taking? Authorizing Provider  amoxicillin-clavulanate (AUGMENTIN) 875-125 MG tablet Take 1 tablet by mouth 2 (two) times daily for 10 days. 02/15/21 02/25/21 Yes Pia Mau M, PA-C  clindamycin (CLEOCIN) 300 MG capsule Take 1 capsule (300 mg total) by mouth 3 (three) times daily for 10 days. 02/15/21 02/25/21 Yes Pia Mau M, PA-C  naproxen (NAPROSYN) 500 MG tablet Take 1 tablet (500 mg total) by mouth 2 (two) times daily with a meal. 12/08/19   Jene Every, MD  prochlorperazine (COMPAZINE) 10 MG tablet Take 1 tablet (10 mg total) by mouth every 6 (six) hours as needed for nausea. 01/15/16   Irean Hong, MD    Allergies Patient has no known allergies.  No family history on file.  Social History Social History   Tobacco Use    Smoking status: Every Day    Packs/day: 1.00    Types: E-cigarettes, Cigarettes   Smokeless tobacco: Never  Vaping Use   Vaping Use: Every day  Substance Use Topics   Alcohol use: Yes    Comment: occ monthly   Drug use: No     Review of Systems  Constitutional: No fever/chills Eyes:  No discharge ENT: No upper respiratory complaints. Respiratory: no cough. No SOB/ use of accessory muscles to breath Gastrointestinal:   No nausea, no vomiting.  No diarrhea.  No constipation. Musculoskeletal: Negative for musculoskeletal pain. Skin: Patient has left-sided periorbital edema and erythema.   ____________________________________________   PHYSICAL EXAM:  VITAL SIGNS: ED Triage Vitals [02/15/21 1506]  Enc Vitals Group     BP 119/79     Pulse Rate 74     Resp 20     Temp 98.1 F (36.7 C)     Temp Source Oral     SpO2 100 %     Weight 135 lb (61.2 kg)     Height 5\' 6"  (1.676 m)     Head Circumference      Peak Flow      Pain Score 8     Pain Loc      Pain Edu?      Excl. in GC?      Constitutional: Alert and oriented. Well appearing  and in no acute distress. Eyes: Conjunctivae are normal. PERRL. EOMI. Patient has left-sided periorbital edema and erythema. Head: Atraumatic. ENT:      Nose: No congestion/rhinnorhea.      Mouth/Throat: Mucous membranes are moist.  Neck: No stridor.  No cervical spine tenderness to palpation. Cardiovascular: Normal rate, regular rhythm. Normal S1 and S2.  Good peripheral circulation. Respiratory: Normal respiratory effort without tachypnea or retractions. Lungs CTAB. Good air entry to the bases with no decreased or absent breath sounds Gastrointestinal: Bowel sounds x 4 quadrants. Soft and nontender to palpation. No guarding or rigidity. No distention. Musculoskeletal: Full range of motion to all extremities. No obvious deformities noted Neurologic:  Normal for age. No gross focal neurologic deficits are appreciated.  Psychiatric: Mood  and affect are normal for age. Speech and behavior are normal.   ____________________________________________   LABS (all labs ordered are listed, but only abnormal results are displayed)  Labs Reviewed  CBC WITH DIFFERENTIAL/PLATELET - Abnormal; Notable for the following components:      Result Value   Hemoglobin 11.2 (*)    HCT 34.5 (*)    All other components within normal limits  COMPREHENSIVE METABOLIC PANEL - Abnormal; Notable for the following components:   Potassium 3.3 (*)    Calcium 8.7 (*)    All other components within normal limits   ____________________________________________  EKG   ____________________________________________  RADIOLOGY Geraldo Pitter, personally viewed and evaluated these images (plain radiographs) as part of my medical decision making, as well as reviewing the written report by the radiologist.    CT Maxillofacial W Contrast  Result Date: 02/15/2021 CLINICAL DATA:  Cellulitis, face EXAM: CT MAXILLOFACIAL WITH CONTRAST TECHNIQUE: Multidetector CT imaging of the maxillofacial structures was performed with intravenous contrast. Multiplanar CT image reconstructions were also generated. CONTRAST:  28mL OMNIPAQUE IOHEXOL 300 MG/ML  SOLN COMPARISON:  None. FINDINGS: Osseous: No fracture or mandibular dislocation. No destructive process. Orbits: Negative. No traumatic or inflammatory finding. Sinuses: Inferior left maxillary sinus mucosal thickening. Remaining sinuses are clear. Soft tissues: Soft tissue thickening overlying the left maxilla with edema/stranding in the overlying left cheek which extends inferiorly into the upper neck. No definite discrete, drainable fluid collection. Other: In the region of left maxillary soft tissue thickening described above there is a carious left first premolar with periapical lucency and overlying cortical dehiscence. Multiple additional carious left maxillary molars. Limited intracranial: No evidence of obvious  acute abnormality in the visualized brain on limited assessment. IMPRESSION: 1. Soft tissue thickening overlying the left maxilla with edema/stranding in the overlying left cheek which extends inferiorly into the upper neck, probably phlegmon/cellulitis. No definite discrete, drainable fluid collection to suggest abscess. Findings may be odontogenic in etiology given multiple carious left maxillary molars, including the left first premolar with periapical lucency and overlying cortical dehiscence in the region of soft tissue thickening. 2. Inferior left maxillary sinus mucosal thickening, potentially odontogenic given the above findings. Electronically Signed   By: Feliberto Harts M.D.   On: 02/15/2021 18:04    ____________________________________________    PROCEDURES  Procedure(s) performed:     Procedures     Medications  clindamycin (CLEOCIN) IVPB 600 mg (0 mg Intravenous Stopped 02/15/21 1747)  oxyCODONE-acetaminophen (PERCOCET/ROXICET) 5-325 MG per tablet 1 tablet (1 tablet Oral Given 02/15/21 1648)  ondansetron (ZOFRAN-ODT) disintegrating tablet 4 mg (4 mg Oral Given 02/15/21 1648)  iohexol (OMNIPAQUE) 300 MG/ML solution 75 mL (75 mLs Intravenous Contrast Given 02/15/21 1744)  acetaminophen (TYLENOL) tablet  650 mg (650 mg Oral Given 02/15/21 1841)     ____________________________________________   INITIAL IMPRESSION / ASSESSMENT AND PLAN / ED COURSE  Pertinent labs & imaging results that were available during my care of the patient were reviewed by me and considered in my medical decision making (see chart for details).      Assessment and Plan:  Periorbital erythema  Facial cellulitis 30 year old female presents to the emergency department with swelling of the left midface and periorbital erythema and edema.  Vital signs are reassuring at triage.  On physical exam, patient was alert, active and nontoxic-appearing with no increased work of breathing.  CT max face was  consistent with facial cellulitis with no drainable fluid collection.  Patient was given IV clindamycin in the emergency department and discharged with both clindamycin and Augmentin.  Patient was given return precautions to return to the emergency department if symptoms seem to be worsening at home.  All patient questions were answered.   ____________________________________________  FINAL CLINICAL IMPRESSION(S) / ED DIAGNOSES  Final diagnoses:  Periorbital cellulitis of left eye      NEW MEDICATIONS STARTED DURING THIS VISIT:  ED Discharge Orders          Ordered    clindamycin (CLEOCIN) 300 MG capsule  3 times daily        02/15/21 1824    amoxicillin-clavulanate (AUGMENTIN) 875-125 MG tablet  2 times daily        02/15/21 1824                This chart was dictated using voice recognition software/Dragon. Despite best efforts to proofread, errors can occur which can change the meaning. Any change was purely unintentional.     Orvil Feil, PA-C 02/15/21 1847    Sharman Cheek, MD 02/17/21 0005

## 2021-02-15 NOTE — ED Notes (Signed)
Pt arrived from home today with L facial swelling. Pt endorsing recent headaches, and tooth pain with foul taste in mouth recently.

## 2021-02-15 NOTE — ED Notes (Signed)
Dc ppw provided. Pt IV removed. Followup info provided. Pt verbal consent for dc given. Pt assisted off unit on foot

## 2021-06-26 ENCOUNTER — Other Ambulatory Visit: Payer: Self-pay

## 2021-06-26 ENCOUNTER — Emergency Department: Payer: Medicaid Other

## 2021-06-26 ENCOUNTER — Encounter: Payer: Self-pay | Admitting: Emergency Medicine

## 2021-06-26 ENCOUNTER — Emergency Department
Admission: EM | Admit: 2021-06-26 | Discharge: 2021-06-26 | Disposition: A | Discharge status: Home / Self Care | Payer: Medicaid Other | Attending: Emergency Medicine | Admitting: Emergency Medicine

## 2021-06-26 DIAGNOSIS — N12 Tubulo-interstitial nephritis, not specified as acute or chronic: Secondary | ICD-10-CM | POA: Diagnosis not present

## 2021-06-26 DIAGNOSIS — R1030 Lower abdominal pain, unspecified: Secondary | ICD-10-CM | POA: Diagnosis present

## 2021-06-26 DIAGNOSIS — L739 Follicular disorder, unspecified: Secondary | ICD-10-CM | POA: Insufficient documentation

## 2021-06-26 LAB — BASIC METABOLIC PANEL
Anion gap: 7 (ref 5–15)
BUN: 15 mg/dL (ref 6–20)
CO2: 25 mmol/L (ref 22–32)
Calcium: 8.4 mg/dL — ABNORMAL LOW (ref 8.9–10.3)
Chloride: 105 mmol/L (ref 98–111)
Creatinine, Ser: 0.6 mg/dL (ref 0.44–1.00)
GFR, Estimated: >60 mL/min (ref >60)
Glucose, Bld: 101 mg/dL — ABNORMAL HIGH (ref 70–99)
Potassium: 3.8 mmol/L (ref 3.5–5.1)
Sodium: 137 mmol/L (ref 135–145)

## 2021-06-26 LAB — URINALYSIS, ROUTINE W REFLEX MICROSCOPIC
Bilirubin Urine: NEGATIVE
Glucose, UA: NEGATIVE mg/dL
Hgb urine dipstick: NEGATIVE
Ketones, ur: NEGATIVE mg/dL
Nitrite: POSITIVE — AB
Protein, ur: 30 mg/dL — AB
Specific Gravity, Urine: 1.03 (ref 1.005–1.030)
pH: 5 (ref 5.0–8.0)

## 2021-06-26 LAB — CBC
HCT: 38.1 % (ref 36.0–46.0)
Hemoglobin: 12 g/dL (ref 12.0–15.0)
MCH: 25.6 pg — ABNORMAL LOW (ref 26.0–34.0)
MCHC: 31.5 g/dL (ref 30.0–36.0)
MCV: 81.2 fL (ref 80.0–100.0)
Platelets: 234 10*3/uL (ref 150–400)
RBC: 4.69 MIL/uL (ref 3.87–5.11)
RDW: 13.6 % (ref 11.5–15.5)
WBC: 5.7 10*3/uL (ref 4.0–10.5)
nRBC: 0 % (ref 0.0–0.2)

## 2021-06-26 LAB — POC URINE PREG, ED: Preg Test, Ur: NEGATIVE

## 2021-06-26 MED ORDER — CEFTRIAXONE SODIUM 1 G IJ SOLR
1.0000 g | Freq: Once | INTRAMUSCULAR | Status: AC
  Administered 2021-06-26: 1 g via INTRAMUSCULAR
  Filled 2021-06-26: qty 10

## 2021-06-26 MED ORDER — LIDOCAINE HCL (PF) 1 % IJ SOLN
INTRAMUSCULAR | Status: AC
  Filled 2021-06-26: qty 5

## 2021-06-26 MED ORDER — CEPHALEXIN 500 MG PO CAPS: 500.0000 mg | ORAL_CAPSULE | Freq: Four times a day (QID) | ORAL | 0 refills | Status: DC

## 2021-06-26 NOTE — ED Provider Notes (Signed)
? ?[facility] ?Provider Note ? ?Patient Contact: 3:55 PM (approximate) ? ? ?History  ? ?Abdominal Pain, Back Pain, and Knee Pain ? ? ?HPI ? ?Maria Wallace is a 31 y.o. female who presents emergency department 2 complaints.  Patient is complaining of flank pain rating into her lower abdomen as well as right knee pain.  She states that she has a long history of abdominal pain but "no I can never tell me what is wrong."  She states that the pain is changed to where it is normally located centrally in her abdomen pain is now more in the suprapubic region with radiation into her right flank.  Denies history of nephrolithiasis.  No observed hematuria.  Patient denies any vaginal bleeding or discharge.  She denies any constipation, diarrhea.  No emesis.  Patient denies any fevers or chills with the associated abdominal complaints.  Patient is somewhat vague in reporting how long the symptoms have been going on as she keeps referring to the fact that she has abdominal pain all the time.  It appears that change in her pain has been over the last 2 to 3 days.  She is having some dysuria and polyuria at this time.  Patient is also endorsing some right knee pain.  She denies any injury or edema to the knee.  She states that she is unsure whether this also may be coming from a "rash" that is all along the anterior aspect of her knee and shin.  She reports that this rash has been present for roughly a month. ?  ? ? ?Physical Exam  ? ?Triage Vital Signs: ?ED Triage Vitals  ?Enc Vitals Group  ?   BP 06/26/21 1412 114/72  ?   Pulse Rate 06/26/21 1412 79  ?   Resp 06/26/21 1412 20  ?   Temp 06/26/21 1412 98.4 ?F (36.9 ?C)  ?   Temp src --   ?   SpO2 06/26/21 1412 96 %  ?   Weight 06/26/21 1353 134 lb 14.7 oz (61.2 kg)  ?   Height 06/26/21 1353 5\' 6"  (1.676 m)  ?   Head Circumference --   ?   Peak Flow --   ?   Pain Score 06/26/21 1353 7  ?   Pain Loc --   ?   Pain Edu? --   ?   Excl. in GC? --   ? ? ?Most recent  vital signs: ?Vitals:  ? 06/26/21 1412  ?BP: 114/72  ?Pulse: 79  ?Resp: 20  ?Temp: 98.4 ?F (36.9 ?C)  ?SpO2: 96%  ? ? ? ?General: Alert and in no acute distress.   ?Cardiovascular:  Good peripheral perfusion ?Respiratory: Normal respiratory effort without tachypnea or retractions. Lungs CTAB. Good air entry to the bases with no decreased or absent breath sounds. ?Gastrointestinal: Bowel sounds ?4 quadrants. Soft to palpation all quadrants.  Patient reports diffuse tenderness over the abdomen to palpation with worse tenderness in the suprapubic region. No guarding or rigidity. No palpable masses. No distention.  Patient reports bilateral CVA tenderness oh she does not flinch or withdrawal from rather firm percussion to this area ?Musculoskeletal: Full range of motion to all extremities.  Visualization of the right knee reveals no edema, erythema, ecchymosis.  Good range of motion and patient is amatory at this time.  Patient does have what appears to be a folliculitis type rash along the anterior aspect of her knee extending through the shin region.  This  is somewhat linear in distribution however it does not follow dermatomes as it involves the L3, L4, L5 and S4 dermatomes.  Patient appears to have scratched this area with fingernails as there is multiple excoriations in this area.  No areas of fluctuance concerning for underlying abscess. ?Neurologic:  No gross focal neurologic deficits are appreciated.  ?Skin:   No rash noted ?Other: ? ? ?ED Results / Procedures / Treatments  ? ?Labs ?(all labs ordered are listed, but only abnormal results are displayed) ?Labs Reviewed  ?URINALYSIS, ROUTINE W REFLEX MICROSCOPIC - Abnormal; Notable for the following components:  ?    Result Value  ? Color, Urine AMBER (*)   ? APPearance CLOUDY (*)   ? Protein, ur 30 (*)   ? Nitrite POSITIVE (*)   ? Leukocytes,Ua MODERATE (*)   ? Bacteria, UA MANY (*)   ? All other components within normal limits  ?BASIC METABOLIC PANEL - Abnormal;  Notable for the following components:  ? Glucose, Bld 101 (*)   ? Calcium 8.4 (*)   ? All other components within normal limits  ?CBC - Abnormal; Notable for the following components:  ? MCH 25.6 (*)   ? All other components within normal limits  ?POC URINE PREG, ED  ? ? ? ?EKG ? ? ? ? ?RADIOLOGY ? ?I personally viewed and evaluated these images as part of my medical decision making, as well as reviewing the written report by the radiologist. ? ?ED Provider Interpretation: Punctate stone in the left kidney.  No evidence of perinephric stranding, obstructive uropathy. ? ?CT Renal Stone Study ? ?Result Date: 06/26/2021 ?CLINICAL DATA:  Lower abdominal pain radiating to the back. Foul-smelling urine. Concern for renal stones. EXAM: CT ABDOMEN AND PELVIS WITHOUT CONTRAST TECHNIQUE: Multidetector CT imaging of the abdomen and pelvis was performed following the standard protocol without IV contrast. RADIATION DOSE REDUCTION: This exam was performed according to the departmental dose-optimization program which includes automated exposure control, adjustment of the mA and/or kV according to patient size and/or use of iterative reconstruction technique. COMPARISON:  No relevant priors available for comparison at time dictation. FINDINGS: Lower chest: No acute abnormality. Hepatobiliary: Unremarkable non contrasted appearance of the hepatic parenchyma. Gallbladder is unremarkable. No biliary ductal dilation. Pancreas: No pancreatic ductal dilation or evidence of acute inflammation. Spleen: Borderline splenomegaly. Adrenals/Urinary Tract: Bilateral adrenal glands appear normal. No hydronephrosis. Punctate nonobstructive left-sided renal stones. No obstructive ureteral or bladder calculi. Urinary bladder is unremarkable for degree of distension. Stomach/Bowel: No enteric contrast was administered. Stomach is unremarkable degree of distension. No pathologic dilation small or large bowel. No evidence of acute bowel inflammation.  Moderate volume of formed stool throughout the colon. Vascular/Lymphatic: Normal caliber abdominal aorta. No pathologically enlarged abdominal or pelvic lymph nodes. Reproductive: Tubal ligation clips in the pelvis. Uterus and bilateral adnexa are unremarkable. Other: No significant pelvic free fluid. Musculoskeletal: No acute or significant osseous findings. IMPRESSION: 1. Punctate nonobstructive left-sided renal stones. No obstructive ureteral or bladder calculi. 2. Moderate volume of formed stool throughout the colon. Electronically Signed   By: Maudry Mayhew M.D.   On: 06/26/2021 16:34   ? ?PROCEDURES: ? ?Critical Care performed: No ? ?Procedures ? ? ?MEDICATIONS ORDERED IN ED: ?Medications - No data to display ? ? ?IMPRESSION / MDM / ASSESSMENT AND PLAN / ED COURSE  ?I reviewed the triage vital signs and the nursing notes. ?             ?               ? ?  Differential diagnosis includes, but is not limited to, nephrolithiasis, pyelonephritis, UTI, colitis, gastroenteritis, cystitis, knee strain, septic arthritis, cellulitis, abscess, folliculitis ? ? ?Patient's diagnosis is consistent with pyelonephritis, folliculitis.  Patient presented to the emergency department complaining of lower abdominal pain, urinary changes, knee pain.  Patient was reporting chronic abdominal pain which seem to change and worsen over the last 2 to 3 days with urinary changes.  She is complaining of bilateral CVA tenderness as well.  Labs are reassuring but given the complaints patient had CT to evaluate for pyelonephritis and/or nephrolithiasis with UTI.  CT is reassuring and I will treat for pyelonephritis given the patient's symptoms with Rocephin, Keflex at home.  Patient was complaining of some knee pain but was not sure whether this was joint or skin changes overlying the area.  She has a scattered scabbing rash consistent with folliculitis/or scattered cellulitis to the anterior right leg.  This is linear in nature but does not  follow dermatomes and crosses multiple dermatomes at this time limiting my suspicion for shingles.  At this time patient will be treated with antibiotics for same.  Patient was requesting medications

## 2021-06-26 NOTE — ED Triage Notes (Signed)
Pt comes into the ED via POV c/o low abd pain that radiates into the back.  Pt admits to foul urine odor.  Pt also c/o right knee pain. Pt states she has a rash on the ankle that is travelling up to her right knee and that is where the pain is coming from.  Pt in NAD at this time with even and unlabored respirations.  ?

## 2021-06-27 ENCOUNTER — Telehealth: Payer: Self-pay | Admitting: Emergency Medicine

## 2021-06-27 MED ORDER — CEPHALEXIN 500 MG PO CAPS: 500.0000 mg | ORAL_CAPSULE | Freq: Four times a day (QID) | ORAL | 0 refills | Status: DC

## 2021-06-27 NOTE — Telephone Encounter (Cosign Needed)
Patient called back to the emergency department and states that she did not have her prescription.  This was printed at the time of visit and I will now electronically prescribe the prescription for Keflex for the patient.  This is sent to her preferred pharmacy which is [facility] in [place]. ?

## 2021-09-03 DIAGNOSIS — M7989 Other specified soft tissue disorders: Secondary | ICD-10-CM | POA: Diagnosis not present

## 2021-10-16 DIAGNOSIS — R69 Illness, unspecified: Secondary | ICD-10-CM | POA: Diagnosis not present

## 2021-10-16 DIAGNOSIS — Z79899 Other long term (current) drug therapy: Secondary | ICD-10-CM | POA: Diagnosis not present

## 2021-10-16 DIAGNOSIS — F419 Anxiety disorder, unspecified: Secondary | ICD-10-CM | POA: Diagnosis not present

## 2021-10-23 DIAGNOSIS — R69 Illness, unspecified: Secondary | ICD-10-CM | POA: Diagnosis not present

## 2021-10-23 DIAGNOSIS — Z79899 Other long term (current) drug therapy: Secondary | ICD-10-CM | POA: Diagnosis not present

## 2021-10-30 DIAGNOSIS — R69 Illness, unspecified: Secondary | ICD-10-CM | POA: Diagnosis not present

## 2021-10-30 DIAGNOSIS — Z79899 Other long term (current) drug therapy: Secondary | ICD-10-CM | POA: Diagnosis not present

## 2021-10-30 DIAGNOSIS — F419 Anxiety disorder, unspecified: Secondary | ICD-10-CM | POA: Diagnosis not present

## 2021-11-06 DIAGNOSIS — R69 Illness, unspecified: Secondary | ICD-10-CM | POA: Diagnosis not present

## 2021-11-06 DIAGNOSIS — Z79899 Other long term (current) drug therapy: Secondary | ICD-10-CM | POA: Diagnosis not present

## 2021-11-15 DIAGNOSIS — Z79891 Long term (current) use of opiate analgesic: Secondary | ICD-10-CM | POA: Diagnosis not present

## 2021-11-15 DIAGNOSIS — J45909 Unspecified asthma, uncomplicated: Secondary | ICD-10-CM | POA: Diagnosis not present

## 2021-11-15 DIAGNOSIS — Z87891 Personal history of nicotine dependence: Secondary | ICD-10-CM | POA: Diagnosis not present

## 2021-11-15 DIAGNOSIS — N644 Mastodynia: Secondary | ICD-10-CM | POA: Diagnosis not present

## 2021-11-15 DIAGNOSIS — R69 Illness, unspecified: Secondary | ICD-10-CM | POA: Diagnosis not present

## 2021-11-15 DIAGNOSIS — N611 Abscess of the breast and nipple: Secondary | ICD-10-CM | POA: Diagnosis not present

## 2021-11-15 DIAGNOSIS — L539 Erythematous condition, unspecified: Secondary | ICD-10-CM | POA: Diagnosis not present

## 2021-11-16 DIAGNOSIS — N611 Abscess of the breast and nipple: Secondary | ICD-10-CM | POA: Diagnosis not present

## 2021-11-20 DIAGNOSIS — F419 Anxiety disorder, unspecified: Secondary | ICD-10-CM | POA: Diagnosis not present

## 2021-11-20 DIAGNOSIS — Z79899 Other long term (current) drug therapy: Secondary | ICD-10-CM | POA: Diagnosis not present

## 2021-11-20 DIAGNOSIS — R69 Illness, unspecified: Secondary | ICD-10-CM | POA: Diagnosis not present

## 2021-11-27 DIAGNOSIS — F39 Unspecified mood [affective] disorder: Secondary | ICD-10-CM | POA: Diagnosis not present

## 2021-11-27 DIAGNOSIS — F32A Depression, unspecified: Secondary | ICD-10-CM | POA: Diagnosis not present

## 2021-11-27 DIAGNOSIS — Z79899 Other long term (current) drug therapy: Secondary | ICD-10-CM | POA: Diagnosis not present

## 2021-11-27 DIAGNOSIS — F419 Anxiety disorder, unspecified: Secondary | ICD-10-CM | POA: Diagnosis not present

## 2021-11-27 DIAGNOSIS — R69 Illness, unspecified: Secondary | ICD-10-CM | POA: Diagnosis not present

## 2021-11-29 ENCOUNTER — Other Ambulatory Visit: Payer: Self-pay | Admitting: Family Medicine

## 2021-11-29 DIAGNOSIS — N63 Unspecified lump in unspecified breast: Secondary | ICD-10-CM

## 2021-12-04 DIAGNOSIS — F419 Anxiety disorder, unspecified: Secondary | ICD-10-CM | POA: Diagnosis not present

## 2021-12-04 DIAGNOSIS — Z79899 Other long term (current) drug therapy: Secondary | ICD-10-CM | POA: Diagnosis not present

## 2021-12-04 DIAGNOSIS — R69 Illness, unspecified: Secondary | ICD-10-CM | POA: Diagnosis not present

## 2021-12-16 ENCOUNTER — Other Ambulatory Visit: Payer: Medicaid Other

## 2021-12-16 ENCOUNTER — Inpatient Hospital Stay: Admission: RE | Admit: 2021-12-16 | Payer: Medicaid Other | Source: Ambulatory Visit

## 2022-03-03 DIAGNOSIS — F419 Anxiety disorder, unspecified: Secondary | ICD-10-CM | POA: Diagnosis not present

## 2022-03-03 DIAGNOSIS — R69 Illness, unspecified: Secondary | ICD-10-CM | POA: Diagnosis not present

## 2022-07-29 ENCOUNTER — Other Ambulatory Visit: Payer: Medicaid Other

## 2022-07-29 ENCOUNTER — Inpatient Hospital Stay: Admission: RE | Admit: 2022-07-29 | Payer: Medicaid Other | Source: Ambulatory Visit

## 2023-05-20 ENCOUNTER — Telehealth: Payer: Self-pay | Admitting: Family Medicine

## 2023-05-20 NOTE — Telephone Encounter (Signed)
Please give me a call I want to know if the dog that bit [place] was put down or not

## 2023-06-24 ENCOUNTER — Encounter: Payer: Self-pay | Admitting: Psychiatry

## 2023-06-24 ENCOUNTER — Ambulatory Visit (INDEPENDENT_AMBULATORY_CARE_PROVIDER_SITE_OTHER): Payer: MEDICAID | Admitting: Psychiatry

## 2023-06-24 VITALS — BP 138/88 | HR 77 | Temp 98.5°F | Ht 66.0 in | Wt 195.0 lb

## 2023-06-24 DIAGNOSIS — F1191 Opioid use, unspecified, in remission: Secondary | ICD-10-CM

## 2023-06-24 DIAGNOSIS — F3132 Bipolar disorder, current episode depressed, moderate: Secondary | ICD-10-CM

## 2023-06-24 DIAGNOSIS — F5105 Insomnia due to other mental disorder: Secondary | ICD-10-CM | POA: Diagnosis not present

## 2023-06-24 DIAGNOSIS — F411 Generalized anxiety disorder: Secondary | ICD-10-CM

## 2023-06-24 DIAGNOSIS — F99 Mental disorder, not otherwise specified: Secondary | ICD-10-CM

## 2023-06-24 MED ORDER — TRAZODONE HCL 50 MG PO TABS
50.0000 mg | ORAL_TABLET | Freq: Every day | ORAL | 0 refills | Status: AC
Start: 2023-06-24 — End: ?

## 2023-06-24 MED ORDER — LITHIUM CARBONATE ER 300 MG PO TBCR: 300.0000 mg | EXTENDED_RELEASE_TABLET | Freq: Two times a day (BID) | ORAL | 0 refills | Status: AC

## 2023-06-24 MED ORDER — HYDROXYZINE HCL 25 MG PO TABS: 25.0000 mg | ORAL_TABLET | Freq: Three times a day (TID) | ORAL | 0 refills | Status: AC | PRN

## 2023-06-24 NOTE — Progress Notes (Addendum)
 Psychiatric Initial Adult Assessment   Patient Identification: Maria Wallace MRN:  [id number] Date of Evaluation:  06/24/2023 Referral Source: Heywood Footman MD Chief Complaint:   Chief Complaint  Patient presents with   Establish Care   Visit Diagnosis:    ICD-10-CM   1. Bipolar disorder with moderate depression (HCC)  F31.32 Lithium level    Lithium level    2. Anxiety state  F41.1     3. Opioid use disorder in remission  F11.91     4. Insomnia due to other mental disorder  F51.05 traZODone (DESYREL) 50 MG tablet   F99       History of Present Illness: 33 year old female presenting to [facility] for medication management, psychiatric evaluation as well as new patient visit.  Patient reports that she is currently being [facility] for opioid use disorder that is in remission while taking Suboxone 8 mg 3 times a day.  Patient reports that she is experiencing anxiety and depression and has been off of her meds for 4 years.  She states that she was instructed to stop all of her medication when she became pregnant and has been off since.  Patient is currently experiencing anhedonia, isolation, social anxiety, feelings of worthlessness, decreased energy as well as irritability restlessness and, panic attacks.  Patient describes panic attacks as having daily that lasts about 30 minutes and when she is unable to catch her breath.  When asked about medication she has been taking for years ago she reports that she was taking Klonopin and Lamictal.  When asked about sleep patient reports that she is only getting 4 to 5 hours of sleep.   [facility] in [facility] patient is currently receiving Suboxone and gabapentin.  With consideration to this patient was educated on holding off of prescribing benzos due to patient's existing opioid use disorder, and is recommended to take hydroxyzine 25 mg 3 times a day as needed for anxiety and panic attacks.   Patient closely monitor symptoms and notify provider on the next visit in 2 weeks regarding medication effect as well as management.  Patient reports that she has taken lithium in the past recently and was satisfied with it so patient will be started on lithium 300 mg twice a day by mouth for bipolar disorder, with depression.  Patient will also follow-up with the lab once a week to determine therapeutic levels of lithium.  Patient will also be prescribed trazodone 50 mg as needed for sleep, patient has been educated on the need for notifying symptoms of sweating, tremors, or seizures to the staff here at the clinic.   Patient was in agreement with this medication plan and currently is not interested in talk therapy.  Patient will follow-up in 2 weeks for medication management, monitoring as well as symptom management.  Patient currently denies SI, HI.  Patient also endorses having no firearms at home.  Patient educated to call the clinic should she have worsening symptoms.  Associated Signs/Symptoms: Depression Symptoms:  depressed mood, anhedonia, feelings of worthlessness/guilt, difficulty concentrating, hopelessness, anxiety, panic attacks, (Hypo) Manic Symptoms: Denies Anxiety Symptoms:  Excessive Worry, Psychotic Symptoms: Negative PTSD Symptoms: Negative  Past Psychiatric History: Patient endorses having bipolar disorder in the past.  Previous Psychotropic Medications: Yes , med trials include Lamictal, Klonopin, lithium  Substance Abuse History in the last 12 months:  Yes.    Consequences of Substance Abuse: Negative, currently being managed at [facility] [place].  Past Medical History:  Past Medical History:  Diagnosis Date   Anxiety    Bipolar 1 disorder (HCC)    Depression    Personality disorder [facility])     Past Surgical History:  Procedure Laterality Date   MYRINGOTOMY     TUBAL LIGATION      Family Psychiatric History: Endorses depression with biological  mother, father and brother.  Anxiety, with biological brother.  Family History:  Family History  Problem Relation Age of Onset   Depression Mother    Depression Father    ADD / ADHD Sister    ADD / ADHD Brother     Social History:   Social History   Socioeconomic History   Marital status: Single    Spouse name: Not on file   Number of children: 4   Years of education: Not on file   Highest education level: 12th grade  Occupational History   Not on file  Tobacco Use   Smoking status: Every Day    Current packs/day: 1.00    Types: E-cigarettes, Cigarettes   Smokeless tobacco: Never  Vaping Use   Vaping status: Every Day  Substance and Sexual Activity   Alcohol use: Yes    Comment: occ monthly   Drug use: No   Sexual activity: Yes  Other Topics Concern   Not on file  Social History Narrative   Not on file   Social Drivers of Health   Financial Resource Strain: Not on file  Food Insecurity: Not on file  Transportation Needs: Not on file  Physical Activity: Not on file  Stress: Not on file  Social Connections: Not on file    Additional Social History: 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  Childhood, rated as "it was in great".  Academic performance, complete high school with diploma.  Residence, home.  Children, 4 children ages 16,6, 7 and 43.  Occupation, not employed.  Siblings, 1 biological brother, 1 sister.  Relationship, single.  Support system, rated as "fair ".  Denies seizures, head trauma, LOC.  Allergies:  No Known Allergies  Metabolic Disorder Labs: No results found for: "HGBA1C", "MPG" No results found for: "PROLACTIN" No results found for: "CHOL", "TRIG", "HDL", "CHOLHDL", "VLDL", "LDLCALC" No results found for: "TSH"  Therapeutic Level Labs: No results found for: "LITHIUM" No results found for: "CBMZ" No results found for: "VALPROATE"  Current Medications: Current Outpatient Medications  Medication Sig  Dispense Refill   gabapentin (NEURONTIN) 600 MG tablet Take 600 mg by mouth 3 (three) times daily.     naloxone (NARCAN) nasal spray 4 mg/0.1 mL One spray in either nostril once for known/suspected opioid overdose. May repeat every 2-3 minutes in alternating nostril til EMS arrives     SUBOXONE 8-2 MG FILM SUBOXONE 8-2 MG FILM     SYMBICORT 80-4.5 MCG/ACT inhaler Inhale 2 puffs into the lungs.     No current facility-administered medications for this visit.    Musculoskeletal: Strength & Muscle Tone: within normal limits Gait & Station: normal Patient leans: N/A  Psychiatric Specialty Exam: Review of Systems  Constitutional: Negative.   HENT: Negative.    Eyes: Negative.   Respiratory: Negative.    Cardiovascular: Negative.   Gastrointestinal: Negative.   Endocrine: Negative.   Genitourinary: Negative.   Musculoskeletal: Negative.   Skin: Negative.   Allergic/Immunologic: Negative.   Neurological: Negative.   Hematological: Negative.     Blood pressure 138/88, pulse 77, temperature 98.5  F (36.9 C), temperature source Temporal, height 5\' 6"  (1.676 m), weight 195 lb (88.5 kg), last menstrual period 06/05/2023, SpO2 99%.Body mass index is 31.47 kg/m.  General Appearance: Fairly Groomed  Eye Contact:  Fair  Speech:  Clear and Coherent  Volume:  Normal  Mood:  Depressed  Affect:  Depressed  Thought Process:  Coherent  Orientation:  Full (Time, Place, and Person)  Thought Content:  Logical  Suicidal Thoughts:  No  Homicidal Thoughts:  No  Memory:  Immediate;   Good Recent;   Good Remote;   Good  Judgement:  Good  Insight:  Good  Psychomotor Activity:  Normal  Concentration:  Concentration: Good and Attention Span: Good  Recall:  Good  Fund of Knowledge:Good  Language: Good  Akathisia:  No  Handed:  Right  AIMS (if indicated):    Assets:  Desire for Improvement Financial Resources/Insurance Housing  ADL's:  Intact  Cognition: WNL  Sleep:  Good    Screenings: Flowsheet Row ED from 06/26/2021 in [facility] Emergency Department at [facility] ED from 02/15/2021 in [facility] Emergency Department at [facility] ED from 01/03/2021 in [facility] Emergency Department at [facility]  C-SSRS RISK CATEGORY No Risk No Risk No Risk       Assessment and Plan:  Assessment - Diagnosis: Bipolar disorder with moderate depression (HCC) [F31.32]  2. Anxiety state [F41.1]  3. Opioid use disorder in remission [F11.91]  4. Insomnia due to other mental disorder [F51.05, F99]   Differential Diagnosis: Generalized Anxiety Disorder Panic Disorder - Progress: Baseline appointment - Risk Factors: Worsening symptoms.  Plan - Medications:  Start lithium 300 mg p.o. twice a day for bipolar depression, pt educated to monitor symptoms of possible lithium toxicity which include severe hand tremors, nausea and vomiting, confusion and vision changes.  Start hydroxyzine 25 mg p.o. 3 times daily as needed for anxiety and panic attacks pt advised not to take this and drive.  Start trazodone 50 mg p.o. as needed for insomnia, pt notified of side effects of gi irritability, dizziness, and irritability. Notify provider if symptoms last for longer than 1 month.  - Psychotherapy: Declined psychotherapy at this time - Education: Patient educated on medication, purpose, side effects and adverse reactions.  Patient also educated on the importance of monitoring lithium levels.  Patient has previous history of using lithium and is in full understanding of the importance of monitoring lab levels of lithium.  Patient also denies firearms within the home as well as denies SI, HI. - Follow-Up: Patient recommended follow-up in 2 weeks - Referrals: No referrals - Safety Planning: Patient was educated on the need to notify the clinic should she have worsening symptoms.  Patient also agrees should she experience with suicidal thoughts with or without a plan to  call 911 or go to the emergency department.  Patient denies having firearms in the home.  Patient currently denies SI, HI.     Patient/Guardian was advised Release of Information must be obtained prior to any record release in order to collaborate their care with an outside provider. Patient/Guardian was advised if they have not already done so to contact the registration department to sign all necessary forms in order for [place] to release information regarding their care.   Consent: Patient/Guardian gives verbal consent for treatment and assignment of benefits for services provided during this visit. Patient/Guardian expressed understanding and agreed to proceed.   Juliann Pares, NP 3/19/20258:38 AM

## 2023-07-06 IMAGING — CT CT RENAL STONE PROTOCOL
3 of 4 series · 10 of 46 positions shown, 15 images · non-contrast
Comparison: No relevant priors available for comparison at time
dictation.

CLINICAL DATA: Lower abdominal pain radiating to the back.
Foul-smelling urine. Concern for renal stones.



[Series 4: lung bases · axial · 0.72mm/px · z∈[-136,-16]mm · 6 of 34 slices shown, 11 images]
[im 5/34  soft-tissue]
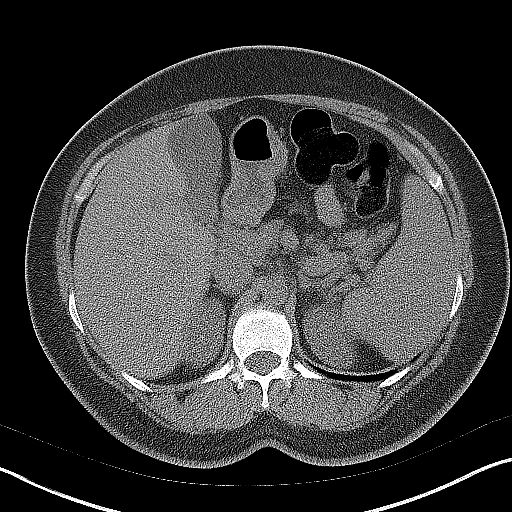
[im 5/34  bone]
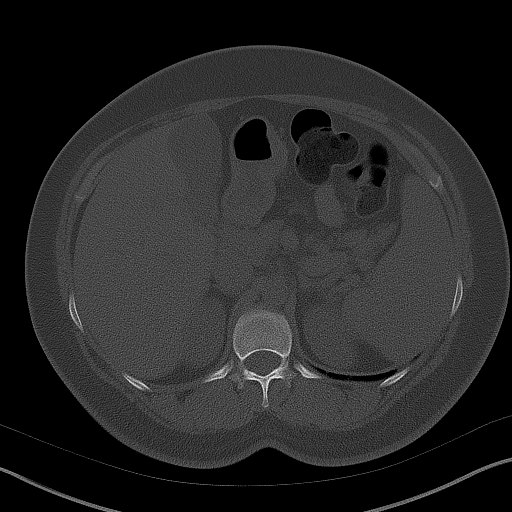
[im 10/34  soft-tissue]
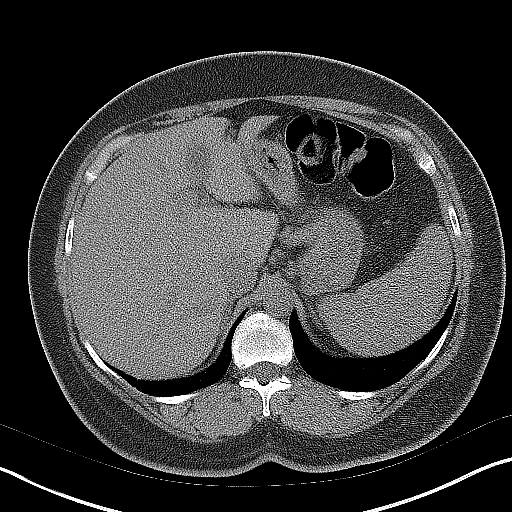
[im 15/34  soft-tissue]
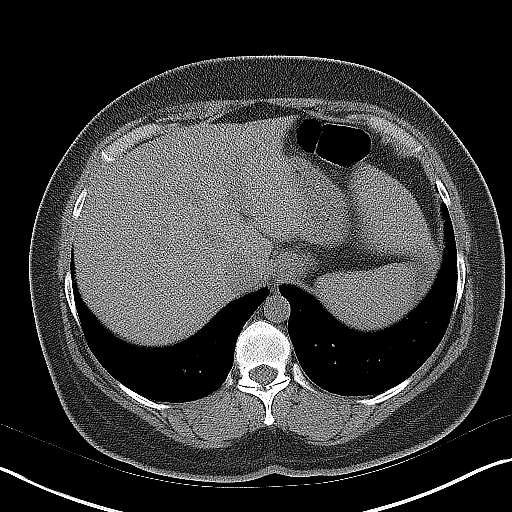
[im 15/34  lung]
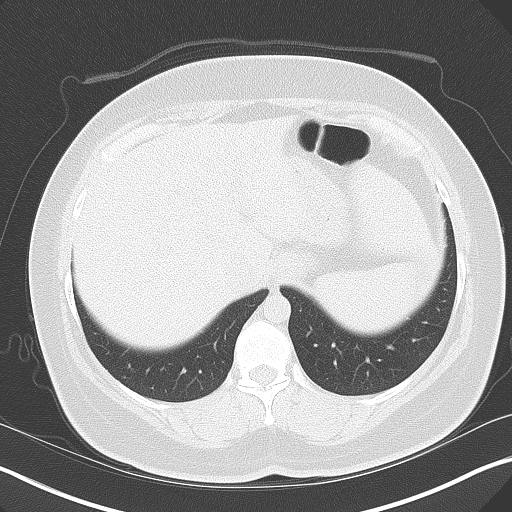
[im 19/34  soft-tissue]
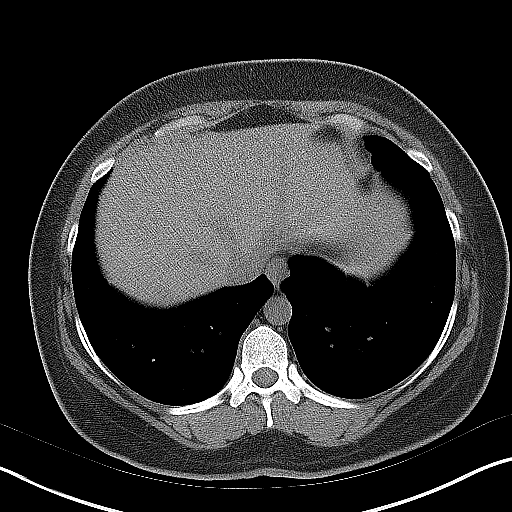
[im 19/34  lung]
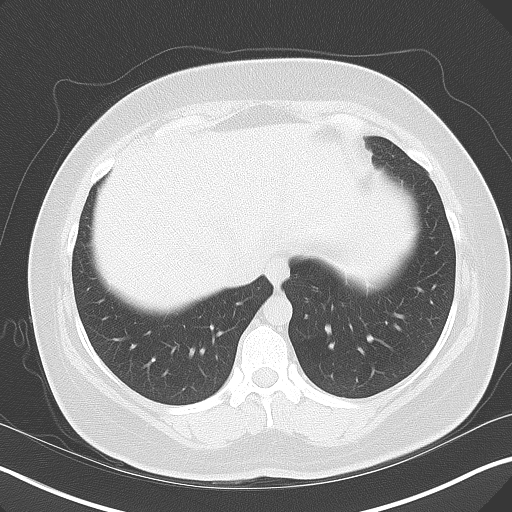
[im 24/34  soft-tissue]
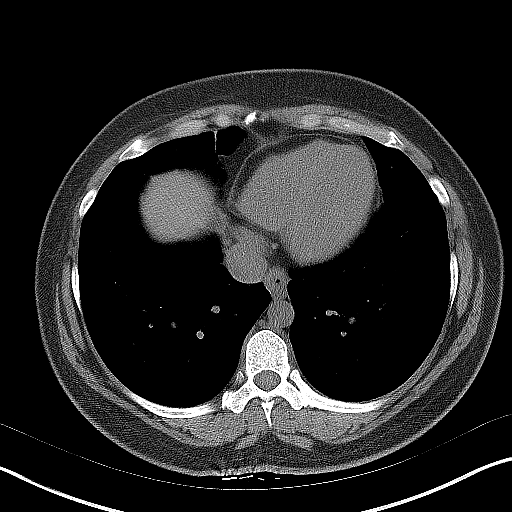
[im 24/34  lung]
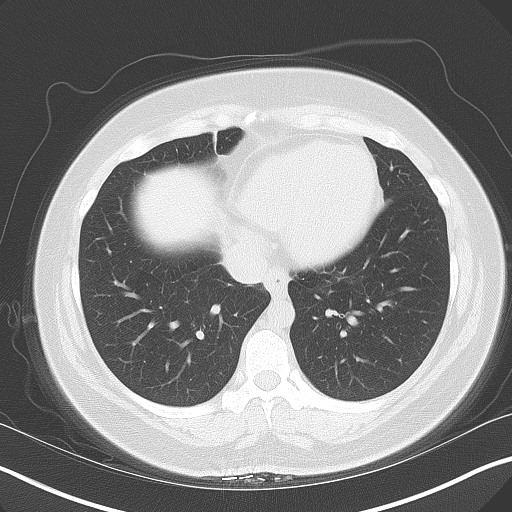
[im 29/34  soft-tissue]
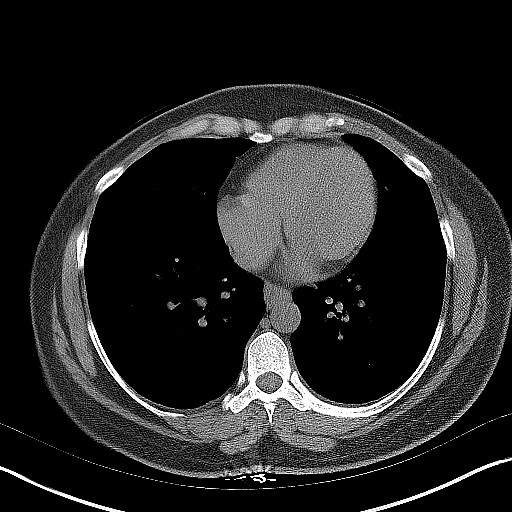
[im 29/34  lung]
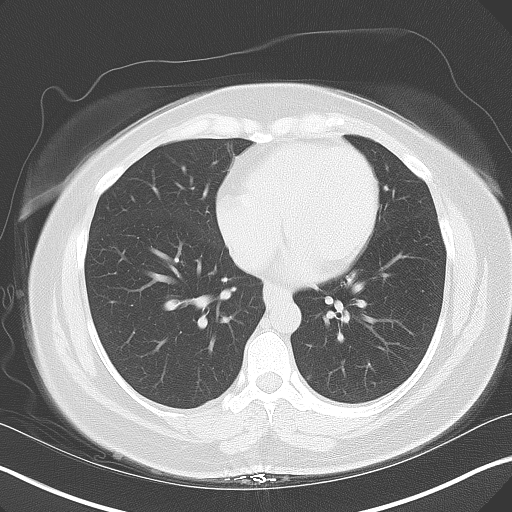

[Series 5: coronal · coronal · 0.76mm/px · 3 of 149 slices shown]
[im 50/149  soft-tissue]
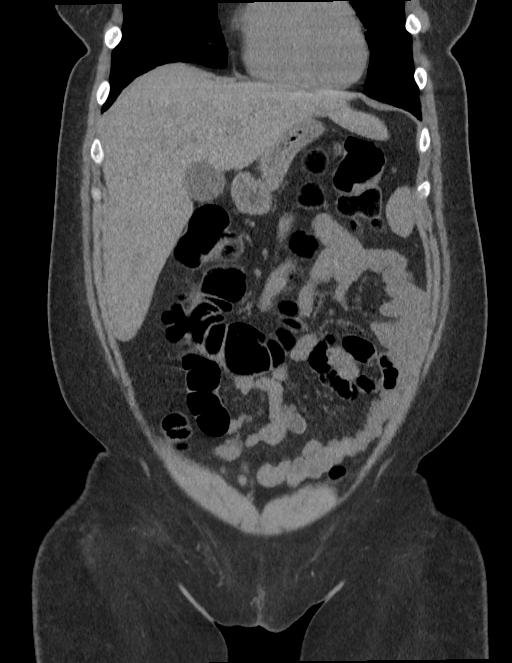
[im 66/149  soft-tissue]
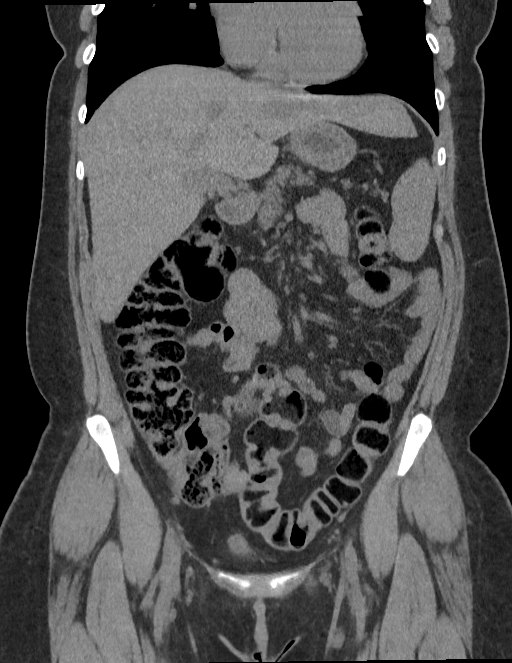
[im 83/149  soft-tissue]
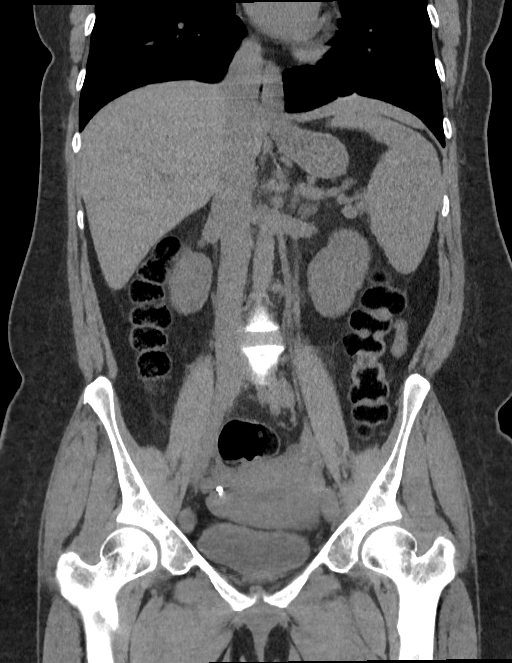

[Series 6: sagittal · sagittal · 0.59mm/px · 1 of 198 slices shown]
[im 66/198  soft-tissue]
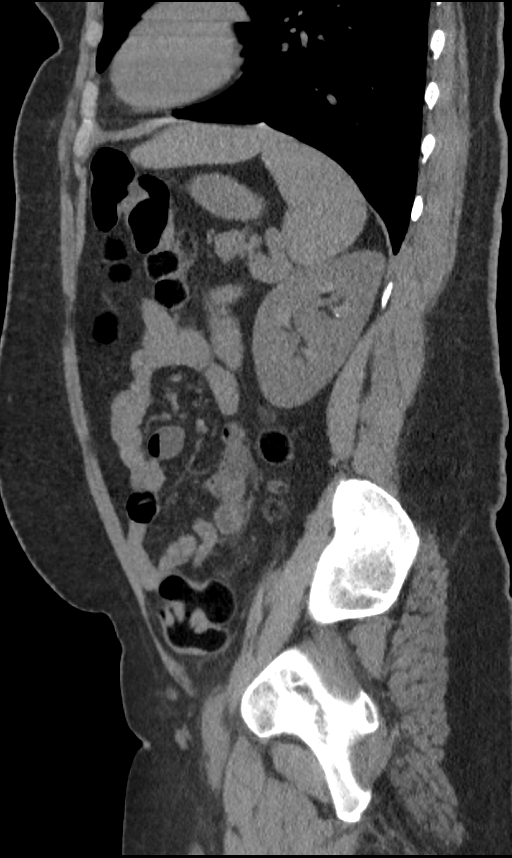

[10 of 46 positions shown; findings below may reference images not displayed]

FINDINGS: Lower chest: No acute abnormality.

Hepatobiliary: Unremarkable non contrasted appearance of the hepatic
parenchyma. Gallbladder is unremarkable. No biliary ductal dilation.

Pancreas: No pancreatic ductal dilation or evidence of acute
inflammation.

Spleen: Borderline splenomegaly.

Adrenals/Urinary Tract: Bilateral adrenal glands appear normal. No
hydronephrosis. Punctate nonobstructive left-sided renal stones. No
obstructive ureteral or bladder calculi. Urinary bladder is
unremarkable for degree of distension.

Stomach/Bowel: No enteric contrast was administered. Stomach is
unremarkable degree of distension. No pathologic dilation small or
large bowel. No evidence of acute bowel inflammation. Moderate
volume of formed stool throughout the colon.

Vascular/Lymphatic: Normal caliber abdominal aorta. No
pathologically enlarged abdominal or pelvic lymph nodes.

Reproductive: Tubal ligation clips in the pelvis. Uterus and
bilateral adnexa are unremarkable.

Other: No significant pelvic free fluid.

Musculoskeletal: No acute or significant osseous findings.
IMPRESSION: 1. Punctate nonobstructive left-sided renal stones. No obstructive
ureteral or bladder calculi.
2. Moderate volume of formed stool throughout the colon.

## 2023-07-08 ENCOUNTER — Ambulatory Visit: Payer: MEDICAID | Admitting: Psychiatry

## 2024-01-05 ENCOUNTER — Ambulatory Visit: Payer: MEDICAID | Admitting: Podiatry

## 2024-01-21 ENCOUNTER — Encounter: Payer: Self-pay | Admitting: Obstetrics and Gynecology

## 2024-01-21 ENCOUNTER — Encounter: Payer: MEDICAID | Admitting: Obstetrics and Gynecology

## 2024-01-25 NOTE — Progress Notes (Signed)
 Patient did not keep her GYN new patient appointment for 01/21/2024.  Bebe Izell Raddle MD Attending Center for [employer] [facility])

## 2024-04-08 ENCOUNTER — Encounter: Payer: MEDICAID | Admitting: Obstetrics and Gynecology
